# Patient Record
Sex: Female | Born: 1985 | Race: White | Hispanic: No | Marital: Single | Smoking: Current every day smoker
Health system: Southern US, Community
[De-identification: ages and names within clinical notes are randomized; demographics above are authoritative.]

## PROBLEM LIST (undated history)

## (undated) ENCOUNTER — Inpatient Hospital Stay (HOSPITAL_COMMUNITY): Payer: Self-pay

## (undated) DIAGNOSIS — N390 Urinary tract infection, site not specified: Secondary | ICD-10-CM

## (undated) DIAGNOSIS — C539 Malignant neoplasm of cervix uteri, unspecified: Secondary | ICD-10-CM

## (undated) DIAGNOSIS — G8929 Other chronic pain: Secondary | ICD-10-CM

## (undated) DIAGNOSIS — B1920 Unspecified viral hepatitis C without hepatic coma: Secondary | ICD-10-CM

## (undated) DIAGNOSIS — R011 Cardiac murmur, unspecified: Secondary | ICD-10-CM

## (undated) DIAGNOSIS — F4310 Post-traumatic stress disorder, unspecified: Secondary | ICD-10-CM

## (undated) DIAGNOSIS — M545 Low back pain, unspecified: Secondary | ICD-10-CM

## (undated) DIAGNOSIS — K219 Gastro-esophageal reflux disease without esophagitis: Secondary | ICD-10-CM

## (undated) DIAGNOSIS — M009 Pyogenic arthritis, unspecified: Secondary | ICD-10-CM

## (undated) DIAGNOSIS — G43909 Migraine, unspecified, not intractable, without status migrainosus: Secondary | ICD-10-CM

## (undated) DIAGNOSIS — F319 Bipolar disorder, unspecified: Secondary | ICD-10-CM

## (undated) DIAGNOSIS — M069 Rheumatoid arthritis, unspecified: Secondary | ICD-10-CM

## (undated) DIAGNOSIS — I82409 Acute embolism and thrombosis of unspecified deep veins of unspecified lower extremity: Secondary | ICD-10-CM

## (undated) DIAGNOSIS — F32A Depression, unspecified: Secondary | ICD-10-CM

## (undated) DIAGNOSIS — F329 Major depressive disorder, single episode, unspecified: Secondary | ICD-10-CM

## (undated) DIAGNOSIS — M797 Fibromyalgia: Secondary | ICD-10-CM

## (undated) DIAGNOSIS — F419 Anxiety disorder, unspecified: Secondary | ICD-10-CM

## (undated) DIAGNOSIS — C4361 Malignant melanoma of right upper limb, including shoulder: Secondary | ICD-10-CM

## (undated) HISTORY — PX: MELANOMA EXCISION: SHX5266

## (undated) HISTORY — PX: DILATION AND CURETTAGE OF UTERUS: SHX78

## (undated) HISTORY — PX: TONSILLECTOMY: SUR1361

## (undated) HISTORY — PX: WRIST FRACTURE SURGERY: SHX121

## (undated) HISTORY — DX: Pyogenic arthritis, unspecified: M00.9

## (undated) HISTORY — PX: HIP SURGERY: SHX245

## (undated) HISTORY — PX: FRACTURE SURGERY: SHX138

---
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
## 2009-11-11 HISTORY — PX: GYNECOLOGIC CRYOSURGERY: SHX857

## 2017-11-02 ENCOUNTER — Other Ambulatory Visit: Payer: Self-pay

## 2017-11-02 ENCOUNTER — Emergency Department (HOSPITAL_COMMUNITY): Payer: Medicaid Other

## 2017-11-02 ENCOUNTER — Encounter (HOSPITAL_COMMUNITY): Payer: Self-pay | Admitting: *Deleted

## 2017-11-02 ENCOUNTER — Inpatient Hospital Stay (HOSPITAL_COMMUNITY)
Admission: EM | Admit: 2017-11-02 | Discharge: 2017-11-11 | DRG: 418 | Disposition: A | Discharge status: Home / Self Care | Payer: Medicaid Other | Attending: Family Medicine | Admitting: Family Medicine

## 2017-11-02 DIAGNOSIS — B009 Herpesviral infection, unspecified: Secondary | ICD-10-CM | POA: Diagnosis present

## 2017-11-02 DIAGNOSIS — Z8249 Family history of ischemic heart disease and other diseases of the circulatory system: Secondary | ICD-10-CM

## 2017-11-02 DIAGNOSIS — R945 Abnormal results of liver function studies: Secondary | ICD-10-CM

## 2017-11-02 DIAGNOSIS — Z8541 Personal history of malignant neoplasm of cervix uteri: Secondary | ICD-10-CM

## 2017-11-02 DIAGNOSIS — Z590 Homelessness: Secondary | ICD-10-CM

## 2017-11-02 DIAGNOSIS — R112 Nausea with vomiting, unspecified: Secondary | ICD-10-CM | POA: Diagnosis present

## 2017-11-02 DIAGNOSIS — R7989 Other specified abnormal findings of blood chemistry: Secondary | ICD-10-CM | POA: Diagnosis present

## 2017-11-02 DIAGNOSIS — R1011 Right upper quadrant pain: Secondary | ICD-10-CM | POA: Diagnosis not present

## 2017-11-02 DIAGNOSIS — F0630 Mood disorder due to known physiological condition, unspecified: Secondary | ICD-10-CM

## 2017-11-02 DIAGNOSIS — Z86718 Personal history of other venous thrombosis and embolism: Secondary | ICD-10-CM

## 2017-11-02 DIAGNOSIS — G43909 Migraine, unspecified, not intractable, without status migrainosus: Secondary | ICD-10-CM | POA: Diagnosis present

## 2017-11-02 DIAGNOSIS — R0789 Other chest pain: Secondary | ICD-10-CM | POA: Diagnosis present

## 2017-11-02 DIAGNOSIS — K5900 Constipation, unspecified: Secondary | ICD-10-CM

## 2017-11-02 DIAGNOSIS — Z91048 Other nonmedicinal substance allergy status: Secondary | ICD-10-CM

## 2017-11-02 DIAGNOSIS — R748 Abnormal levels of other serum enzymes: Secondary | ICD-10-CM | POA: Diagnosis present

## 2017-11-02 DIAGNOSIS — E038 Other specified hypothyroidism: Secondary | ICD-10-CM | POA: Clinically undetermined

## 2017-11-02 DIAGNOSIS — R740 Nonspecific elevation of levels of transaminase and lactic acid dehydrogenase [LDH]: Secondary | ICD-10-CM

## 2017-11-02 DIAGNOSIS — Z884 Allergy status to anesthetic agent status: Secondary | ICD-10-CM

## 2017-11-02 DIAGNOSIS — R011 Cardiac murmur, unspecified: Secondary | ICD-10-CM | POA: Diagnosis present

## 2017-11-02 DIAGNOSIS — B182 Chronic viral hepatitis C: Secondary | ICD-10-CM | POA: Diagnosis present

## 2017-11-02 DIAGNOSIS — R3915 Urgency of urination: Secondary | ICD-10-CM | POA: Diagnosis not present

## 2017-11-02 DIAGNOSIS — E039 Hypothyroidism, unspecified: Secondary | ICD-10-CM | POA: Diagnosis not present

## 2017-11-02 DIAGNOSIS — F419 Anxiety disorder, unspecified: Secondary | ICD-10-CM | POA: Diagnosis present

## 2017-11-02 DIAGNOSIS — F319 Bipolar disorder, unspecified: Secondary | ICD-10-CM | POA: Diagnosis present

## 2017-11-02 DIAGNOSIS — R109 Unspecified abdominal pain: Secondary | ICD-10-CM

## 2017-11-02 DIAGNOSIS — K219 Gastro-esophageal reflux disease without esophagitis: Secondary | ICD-10-CM | POA: Diagnosis present

## 2017-11-02 DIAGNOSIS — F1910 Other psychoactive substance abuse, uncomplicated: Secondary | ICD-10-CM | POA: Diagnosis not present

## 2017-11-02 DIAGNOSIS — M545 Low back pain: Secondary | ICD-10-CM | POA: Diagnosis present

## 2017-11-02 DIAGNOSIS — Z85820 Personal history of malignant melanoma of skin: Secondary | ICD-10-CM

## 2017-11-02 DIAGNOSIS — M797 Fibromyalgia: Secondary | ICD-10-CM | POA: Diagnosis present

## 2017-11-02 DIAGNOSIS — F4310 Post-traumatic stress disorder, unspecified: Secondary | ICD-10-CM | POA: Diagnosis present

## 2017-11-02 DIAGNOSIS — K811 Chronic cholecystitis: Secondary | ICD-10-CM

## 2017-11-02 DIAGNOSIS — M069 Rheumatoid arthritis, unspecified: Secondary | ICD-10-CM | POA: Diagnosis present

## 2017-11-02 DIAGNOSIS — D649 Anemia, unspecified: Secondary | ICD-10-CM | POA: Diagnosis present

## 2017-11-02 DIAGNOSIS — N119 Chronic tubulo-interstitial nephritis, unspecified: Secondary | ICD-10-CM

## 2017-11-02 DIAGNOSIS — G8929 Other chronic pain: Secondary | ICD-10-CM | POA: Diagnosis present

## 2017-11-02 DIAGNOSIS — F17210 Nicotine dependence, cigarettes, uncomplicated: Secondary | ICD-10-CM | POA: Diagnosis present

## 2017-11-02 DIAGNOSIS — R7401 Elevation of levels of liver transaminase levels: Secondary | ICD-10-CM

## 2017-11-02 DIAGNOSIS — N12 Tubulo-interstitial nephritis, not specified as acute or chronic: Secondary | ICD-10-CM | POA: Diagnosis present

## 2017-11-02 DIAGNOSIS — Z79899 Other long term (current) drug therapy: Secondary | ICD-10-CM

## 2017-11-02 DIAGNOSIS — Z91018 Allergy to other foods: Secondary | ICD-10-CM

## 2017-11-02 HISTORY — DX: Migraine, unspecified, not intractable, without status migrainosus: G43.909

## 2017-11-02 HISTORY — DX: Major depressive disorder, single episode, unspecified: F32.9

## 2017-11-02 HISTORY — DX: Malignant melanoma of right upper limb, including shoulder: C43.61

## 2017-11-02 HISTORY — DX: Low back pain: M54.5

## 2017-11-02 HISTORY — DX: Acute embolism and thrombosis of unspecified deep veins of unspecified lower extremity: I82.409

## 2017-11-02 HISTORY — DX: Depression, unspecified: F32.A

## 2017-11-02 HISTORY — DX: Fibromyalgia: M79.7

## 2017-11-02 HISTORY — DX: Post-traumatic stress disorder, unspecified: F43.10

## 2017-11-02 HISTORY — DX: Low back pain, unspecified: M54.50

## 2017-11-02 HISTORY — DX: Other chronic pain: G89.29

## 2017-11-02 HISTORY — DX: Gastro-esophageal reflux disease without esophagitis: K21.9

## 2017-11-02 HISTORY — DX: Rheumatoid arthritis, unspecified: M06.9

## 2017-11-02 HISTORY — DX: Anxiety disorder, unspecified: F41.9

## 2017-11-02 HISTORY — DX: Malignant neoplasm of cervix uteri, unspecified: C53.9

## 2017-11-02 HISTORY — DX: Cardiac murmur, unspecified: R01.1

## 2017-11-02 HISTORY — DX: Bipolar disorder, unspecified: F31.9

## 2017-11-02 HISTORY — DX: Unspecified viral hepatitis C without hepatic coma: B19.20

## 2017-11-02 HISTORY — DX: Urinary tract infection, site not specified: N39.0

## 2017-11-02 LAB — COMPREHENSIVE METABOLIC PANEL
ALBUMIN: 3.7 g/dL (ref 3.5–5.0)
ALT: 424 U/L — ABNORMAL HIGH (ref 14–54)
ANION GAP: 7 (ref 5–15)
AST: 166 U/L — ABNORMAL HIGH (ref 15–41)
Alkaline Phosphatase: 123 U/L (ref 38–126)
BILIRUBIN TOTAL: 0.7 mg/dL (ref 0.3–1.2)
BUN: 7 mg/dL (ref 6–20)
CO2: 27 mmol/L (ref 22–32)
Calcium: 8.9 mg/dL (ref 8.9–10.3)
Chloride: 106 mmol/L (ref 101–111)
Creatinine, Ser: 0.67 mg/dL (ref 0.44–1.00)
Glucose, Bld: 98 mg/dL (ref 65–99)
POTASSIUM: 3.5 mmol/L (ref 3.5–5.1)
Sodium: 140 mmol/L (ref 135–145)
TOTAL PROTEIN: 7.1 g/dL (ref 6.5–8.1)

## 2017-11-02 LAB — URINALYSIS, ROUTINE W REFLEX MICROSCOPIC
Bacteria, UA: NONE SEEN
Bilirubin Urine: NEGATIVE
GLUCOSE, UA: NEGATIVE mg/dL
Ketones, ur: NEGATIVE mg/dL
Leukocytes, UA: NEGATIVE
Nitrite: NEGATIVE
PH: 6 (ref 5.0–8.0)
Protein, ur: 30 mg/dL — AB
Specific Gravity, Urine: 1.027 (ref 1.005–1.030)

## 2017-11-02 LAB — CBC
HEMATOCRIT: 38 % (ref 36.0–46.0)
Hemoglobin: 12.7 g/dL (ref 12.0–15.0)
MCH: 28.6 pg (ref 26.0–34.0)
MCHC: 33.4 g/dL (ref 30.0–36.0)
MCV: 85.6 fL (ref 78.0–100.0)
Platelets: 218 10*3/uL (ref 150–400)
RBC: 4.44 MIL/uL (ref 3.87–5.11)
RDW: 15.1 % (ref 11.5–15.5)
WBC: 6.4 10*3/uL (ref 4.0–10.5)

## 2017-11-02 LAB — PROTIME-INR
INR: 1.35
Prothrombin Time: 16.6 seconds — ABNORMAL HIGH (ref 11.4–15.2)

## 2017-11-02 LAB — I-STAT BETA HCG BLOOD, ED (MC, WL, AP ONLY)

## 2017-11-02 LAB — ACETAMINOPHEN LEVEL

## 2017-11-02 LAB — ETHANOL

## 2017-11-02 LAB — LIPASE, BLOOD: LIPASE: 88 U/L — AB (ref 11–51)

## 2017-11-02 MED ORDER — ONDANSETRON HCL 4 MG/2ML IJ SOLN
4.0000 mg | Freq: Once | INTRAMUSCULAR | Status: AC
  Administered 2017-11-02: 4 mg via INTRAVENOUS
  Filled 2017-11-02: qty 2

## 2017-11-02 MED ORDER — ONDANSETRON HCL 4 MG PO TABS
4.0000 mg | ORAL_TABLET | Freq: Four times a day (QID) | ORAL | Status: DC | PRN
  Administered 2017-11-09: 4 mg via ORAL
  Filled 2017-11-02 (×2): qty 1

## 2017-11-02 MED ORDER — ONDANSETRON HCL 4 MG/2ML IJ SOLN
4.0000 mg | Freq: Four times a day (QID) | INTRAMUSCULAR | Status: DC | PRN
  Administered 2017-11-03 – 2017-11-08 (×5): 4 mg via INTRAVENOUS
  Filled 2017-11-02 (×8): qty 2

## 2017-11-02 MED ORDER — SODIUM CHLORIDE 0.9 % IV BOLUS (SEPSIS)
1000.0000 mL | Freq: Once | INTRAVENOUS | Status: AC
  Administered 2017-11-02: 1000 mL via INTRAVENOUS

## 2017-11-02 MED ORDER — NICOTINE 21 MG/24HR TD PT24
21.0000 mg | MEDICATED_PATCH | Freq: Every day | TRANSDERMAL | Status: DC
  Administered 2017-11-03 – 2017-11-11 (×10): 21 mg via TRANSDERMAL
  Filled 2017-11-02 (×10): qty 1

## 2017-11-02 MED ORDER — MORPHINE SULFATE (PF) 4 MG/ML IV SOLN
2.0000 mg | INTRAVENOUS | Status: DC | PRN
  Administered 2017-11-02 – 2017-11-03 (×4): 2 mg via INTRAVENOUS
  Filled 2017-11-02 (×4): qty 1

## 2017-11-02 MED ORDER — SODIUM CHLORIDE 0.9 % IV SOLN
INTRAVENOUS | Status: DC
  Administered 2017-11-03 – 2017-11-05 (×5): via INTRAVENOUS

## 2017-11-02 MED ORDER — ENOXAPARIN SODIUM 40 MG/0.4ML ~~LOC~~ SOLN
40.0000 mg | Freq: Every day | SUBCUTANEOUS | Status: DC
  Administered 2017-11-03 – 2017-11-11 (×7): 40 mg via SUBCUTANEOUS
  Filled 2017-11-02 (×9): qty 0.4

## 2017-11-02 NOTE — ED Notes (Signed)
Pt is aware that a urine specimen is needed.

## 2017-11-02 NOTE — ED Notes (Signed)
Patient transported to X-ray 

## 2017-11-02 NOTE — ED Triage Notes (Signed)
Pt reports having gallbladder issues but did not get it addressed due to being at [facility] at time now wants it reevaluated. No acute distress is noted at triage. No n/v/d.

## 2017-11-02 NOTE — ED Notes (Signed)
Admitting provider at bedside.

## 2017-11-02 NOTE — ED Notes (Signed)
ED Provider at bedside. 

## 2017-11-02 NOTE — H&P (Signed)
History and Physical    Yesenia Bennett IHK:[id number] DOB: 19-Jan-1986 DOA: 11/02/2017  Referring MD/NP/PA: Daryll Drown PCP: Patient, No Pcp Per  Patient coming from: Home  Chief Complaint: Right upper quadrant abdominal pain  I have personally briefly reviewed patient's old medical records in [place]  HPI: Yesenia Bennett is a 31 y.o. female with medical history significant of heroin abuse, hepatitis C, anxiety, depression, PTSD, and cervical cancer diagnosed 2010; resents with complaints of right upper quadrant pain for the last 2 weeks.  Describes the pain as a stabbing and cramping pain mostly in the right upper quadrant that radiates to her shoulder.  Eating any food seemed to worsen symptoms.  Associated symptoms include complaints of chills, nausea, nonbloody emesis, poor p.o. intake, heartburn, and dizziness.  Patient was previously seen at [facility] on 12/13 with complaints of nausea and vomiting.  Review of records shows that patient was evaluated and seen to have elevated ALT  267, AST 139, and lipase 41.  It appears she underwent CT scan which showed no acute abnormalities and abdominal ultrasound which showed a small amount of sludge within the gallbladder and mild splenomegaly.  Notes that she has had she just recently completed heroin rehab at [facility], and was the reason why she did not stay at when previously evaluated.  Currently, not on any treatment for her previous history of mood disorders.  ED Course: Upon admission into the emergency room patient was seen to be afebrile with vital signs relatively within normal limits.  Labs revealed AST 166, ALT 424, lipase 88, total bilirubin 0.7, and all other labs relatively within normal limits.  Chest x-ray was unremarkable on right upper quadrant ultrasound showed signs of mild gallbladder wall thickening without signs of gallstones.  She received 2 L of normal saline IV fluids.  General surgery was consulted, but recommended GI  evaluation first as does not appear patient needs a emergent cholecystectomy at this time.  [place] [facility] physician on call was consulted and recommended adding on ethanol level, acetaminophen, hepatitis panel, and INR.  TRH called to admit.  Review of Systems  Constitutional: Positive for chills and malaise/fatigue. Negative for fever.  HENT: Negative for ear discharge and nosebleeds.   Eyes: Negative for pain and discharge.  Respiratory: Positive for cough. Negative for hemoptysis and wheezing.   Cardiovascular: Negative for chest pain and orthopnea.  Gastrointestinal: Positive for abdominal pain, constipation, nausea and vomiting.  Genitourinary: Positive for urgency. Negative for flank pain.  Musculoskeletal: Negative for falls and myalgias.  Skin: Negative for itching and rash.  Neurological: Positive for focal weakness. Negative for sensory change and speech change.  Endo/Heme/Allergies: Negative for polydipsia. Does not bruise/bleed easily.  Psychiatric/Behavioral: Negative for hallucinations and memory loss.    History reviewed. No pertinent past medical history.  History reviewed. No pertinent surgical history.   reports that she has been smoking.  She does not have any smokeless tobacco history on file. She reports that she does not drink alcohol or use drugs.  Allergies  Allergen Reactions  . Citric Acid Anaphylaxis    fruit  . Adhesive [Tape] Other (See Comments)    Blisters   . Erythromycin Swelling and Rash    History reviewed. No pertinent family history.  Prior to Admission medications   Not on File    Physical Exam:  Constitutional: Female who appears to be some generalized discomfort Vitals:   11/02/17 1822  BP: (!) 134/56  Pulse: 72  Resp:  18  Temp: 98 F (36.7 C)  TempSrc: Oral  SpO2: 100%  Weight: 72.6 kg (160 lb)  Height: 5\' 9"  (1.753 m)   Eyes: PERRL, lids and conjunctivae normal ENMT: Mucous membranes are dry. Posterior pharynx clear of any  exudate or lesions. .  Neck: normal, supple, no masses, no thyromegaly Respiratory: Patient intermittently coughing, but sounds clear to auscultation bilaterally, no wheezing, no crackles. Normal respiratory effort. No accessory muscle use.  Cardiovascular: Regular rate and rhythm, no murmurs / rubs / gallops. No extremity edema. 2+ pedal pulses. No carotid bruits.  Abdomen: Tenderness to palpation most notably of the right upper quadrant, but complains of generalized pain when touching in all other quadrants.  Bowel sounds positive. Musculoskeletal: no clubbing / cyanosis. No joint deformity upper and lower extremities. Good ROM, no contractures. Normal muscle tone.  Skin: no rashes, lesions, ulcers. No induration Neurologic: CN 2-12 grossly intact. Sensation intact, DTR normal. Strength 5/5 in all 4.  Psychiatric: Normal judgment and insight. Alert and oriented x 3. Normal mood.     Labs on Admission: I have personally reviewed following labs and imaging studies  CBC: Recent Labs  Lab 11/02/17 2021  WBC 6.4  HGB 12.7  HCT 38.0  MCV 85.6  PLT 374   Basic Metabolic Panel: Recent Labs  Lab 11/02/17 2021  NA 140  K 3.5  CL 106  CO2 27  GLUCOSE 98  BUN 7  CREATININE 0.67  CALCIUM 8.9   GFR: Estimated Creatinine Clearance: 107.5 mL/min (by C-G formula based on SCr of 0.67 mg/dL). Liver Function Tests: Recent Labs  Lab 11/02/17 2021  AST 166*  ALT 424*  ALKPHOS 123  BILITOT 0.7  PROT 7.1  ALBUMIN 3.7   Recent Labs  Lab 11/02/17 2021  LIPASE 88*   No results for input(s): AMMONIA in the last 168 hours. Coagulation Profile: No results for input(s): INR, PROTIME in the last 168 hours. Cardiac Enzymes: No results for input(s): CKTOTAL, CKMB, CKMBINDEX, TROPONINI in the last 168 hours. BNP (last 3 results) No results for input(s): PROBNP in the last 8760 hours. HbA1C: No results for input(s): HGBA1C in the last 72 hours. CBG: No results for input(s): GLUCAP in  the last 168 hours. Lipid Profile: No results for input(s): CHOL, HDL, LDLCALC, TRIG, CHOLHDL, LDLDIRECT in the last 72 hours. Thyroid Function Tests: No results for input(s): TSH, T4TOTAL, FREET4, T3FREE, THYROIDAB in the last 72 hours. Anemia Panel: No results for input(s): VITAMINB12, FOLATE, FERRITIN, TIBC, IRON, RETICCTPCT in the last 72 hours. Urine analysis: No results found for: COLORURINE, APPEARANCEUR, LABSPEC, PHURINE, GLUCOSEU, HGBUR, BILIRUBINUR, KETONESUR, PROTEINUR, UROBILINOGEN, NITRITE, LEUKOCYTESUR Sepsis Labs: No results found for this or any previous visit (from the past 240 hour(s)).   Radiological Exams on Admission: Dg Chest 2 View  Result Date: 11/02/2017 CLINICAL DATA:  31 year old with cough and shortness of breath. EXAM: CHEST  2 VIEW COMPARISON:  None. FINDINGS: The heart size and mediastinal contours are within normal limits. Both lungs are clear. The visualized skeletal structures are unremarkable. IMPRESSION: No active cardiopulmonary disease. Electronically Signed   By: Markus Daft M.D.   On: 11/02/2017 20:43   US Abdomen Limited Ruq  Result Date: 11/02/2017 CLINICAL DATA:  Right upper quadrant pain and vomiting. EXAM: ULTRASOUND ABDOMEN LIMITED RIGHT UPPER QUADRANT COMPARISON:  None. FINDINGS: Gallbladder: Gallbladder is small and contracted. Gallbladder wall is slightly prominent measuring 0.3 cm. Negative for gallstones. Patient does not have a sonographic Murphy sign. Common bile duct:  Diameter: 0.3 cm. Liver: No focal lesion identified. Within normal limits in parenchymal echogenicity. Portal vein is patent on color Doppler imaging with normal direction of blood flow towards the liver. Other:  Large amount of gas in the region of the stomach. IMPRESSION: No acute abnormality. Gallbladder wall is mildly thickened and probably due to the gallbladder contraction. No gallstones. Electronically Signed   By: Markus Daft M.D.   On: 11/02/2017 21:13    U/S  independently reviewed: shows no clear signs of the gallbladder stones   Assessment/Plan Right upper quadrant abdominal pain, elevated lipase, elevated LFTs: Acute.  Patient presents with complaints of worsening right upper quadrant abdominal pain with radiation to her shoulder.  Ultrasound showed mild signs of gallbladder wall thickening suspected secondary to contraction and no gallstones.  Due to the elevation in liver enzymes and lipase question the possibility of a large gallstone. Gastroenterology consulted and will see the patient in a.m.  Will likely need MRCP in am.  - Admit to a MedSurg bed - Monitor intake and output - Normal Saline at 125 mL/h - Morphine IV prn pain - Appreciate GI consultative service, will follow-up for further recommendations - Will Follow-up hepatitis panel, ethanol level, acetaminophen level, and INR  Urinary urgency: Acute.  Patient reports having urinary urgency and pressure symptoms. - Check urinalysis   Hepatitis C: Records show diagnosed with hepatitis C Genotype  1a  back in 2015-2016, and has not had treatment for this at this time.  - Follow-up repeat hepatitis panel   History of subclinical hypothyroidism: Previously documented back in 2016. - Check TSH    History of polysubstance abuse: Previous history of heroin.  Still current uses  tobacco, and unclear about other drugs. - Nicotine patch  Mood disorder history: Patient with previous history of PTSD, anxiety, depression, other diagnoses currently not on any treatment for these at this time.  DVT prophylaxis: Lovenox   Code Status: Full  Family Communication:Plan of care discussed with patient and family present at bedside Disposition Plan:  TBD Consults called:  gastroenterology Admission status: observation  Norval Morton MD [facility] Pager [phone]   If 7PM-7AM, please contact night-coverage [email or web address] Password [facility]  11/02/2017, 10:09 PM

## 2017-11-02 NOTE — ED Provider Notes (Signed)
[place] EMERGENCY DEPARTMENT Provider Note   CSN: [id number] Arrival date & time: 11/02/17  1818     History   Chief Complaint Chief Complaint  Patient presents with  . Abdominal Pain  . Follow-up    Yesenia Bennett is a 31 y.o. female.  HPI  Pleasant 31 year old female here with right upper quadrant pain.  The patient states that she recently completed rehab for heroin at [facility].  She was diagnosed with gallbladder sludge at that time.  She refused admission at for site, however, because she did not lose her bed [facility].  She was then discharged 2 days later due to worsening epigastric and right upper quadrant abdominal pain.  She states that she has aching, throbbing, cramp like right upper quadrant pain that is worse with eating.  It then generally resolves but is becoming more frequent as well as long in duration.  She denies any fevers.  She has had light colored stools.  Denies any changes in her urine.  No vaginal bleeding or discharge.  History reviewed. No pertinent past medical history.  There are no active problems to display for this patient.   History reviewed. No pertinent surgical history.  OB History    No data available       Home Medications    Prior to Admission medications   Not on File    Family History History reviewed. No pertinent family history.  Social History Social History   Tobacco Use  . Smoking status: Current Some Day Smoker  Substance Use Topics  . Alcohol use: No    Frequency: Never  . Drug use: No     Allergies   Adhesive [tape] and Erythromycin   Review of Systems Review of Systems  Constitutional: Negative for chills and fever.  HENT: Negative for congestion, rhinorrhea and sore throat.   Eyes: Negative for visual disturbance.  Respiratory: Negative for cough, shortness of breath and wheezing.   Cardiovascular: Negative for chest pain and leg swelling.  Gastrointestinal: Positive for abdominal  pain, nausea and vomiting. Negative for diarrhea.  Genitourinary: Negative for dysuria, flank pain, vaginal bleeding and vaginal discharge.  Musculoskeletal: Negative for neck pain.  Skin: Negative for rash.  Allergic/Immunologic: Negative for immunocompromised state.  Neurological: Negative for syncope and headaches.  Hematological: Does not bruise/bleed easily.  All other systems reviewed and are negative.    Physical Exam Updated Vital Signs BP (!) 134/56 (BP Location: Right Arm)   Pulse 72   Temp 98 F (36.7 C) (Oral)   Resp 18   Ht 5\' 9"  (1.753 m)   Wt 72.6 kg (160 lb)   LMP 11/01/2017   SpO2 100%   BMI 23.63 kg/m   Physical Exam  Constitutional: She is oriented to person, place, and time. She appears well-developed and well-nourished. No distress.  HENT:  Head: Normocephalic and atraumatic.  Eyes: Conjunctivae are normal.  Neck: Neck supple.  Cardiovascular: Regular rhythm and normal heart sounds. Exam reveals no friction rub.  No murmur heard. Pulmonary/Chest: Effort normal and breath sounds normal. No respiratory distress. She has no wheezes. She has no rales.  Abdominal: Soft. Normal appearance. She exhibits no distension. There is generalized tenderness and tenderness in the right upper quadrant. There is positive Murphy's sign. There is no rigidity, no rebound and no guarding.  Musculoskeletal: She exhibits no edema.  Neurological: She is alert and oriented to person, place, and time. She exhibits normal muscle tone.  Skin: Skin is warm.  Capillary refill takes less than 2 seconds.  Psychiatric: She has a normal mood and affect.  Nursing note and vitals reviewed.    ED Treatments / Results  Labs (all labs ordered are listed, but only abnormal results are displayed) Labs Reviewed  COMPREHENSIVE METABOLIC PANEL - Abnormal; Notable for the following components:      Result Value   AST 166 (*)    ALT 424 (*)    All other components within normal limits    LIPASE, BLOOD - Abnormal; Notable for the following components:   Lipase 88 (*)    All other components within normal limits  CBC  URINALYSIS, ROUTINE W REFLEX MICROSCOPIC  PROTIME-INR  HEPATITIS PANEL, ACUTE  ETHANOL  ACETAMINOPHEN LEVEL  I-STAT BETA HCG BLOOD, ED ([facility], [facility], [facility] ONLY)    EKG  EKG Interpretation None       Radiology Dg Chest 2 View  Result Date: 11/02/2017 CLINICAL DATA:  31 year old with cough and shortness of breath. EXAM: CHEST  2 VIEW COMPARISON:  None. FINDINGS: The heart size and mediastinal contours are within normal limits. Both lungs are clear. The visualized skeletal structures are unremarkable. IMPRESSION: No active cardiopulmonary disease. Electronically Signed   By: Markus Daft M.D.   On: 11/02/2017 20:43   US Abdomen Limited Ruq  Result Date: 11/02/2017 CLINICAL DATA:  Right upper quadrant pain and vomiting. EXAM: ULTRASOUND ABDOMEN LIMITED RIGHT UPPER QUADRANT COMPARISON:  None. FINDINGS: Gallbladder: Gallbladder is small and contracted. Gallbladder wall is slightly prominent measuring 0.3 cm. Negative for gallstones. Patient does not have a sonographic Murphy sign. Common bile duct: Diameter: 0.3 cm. Liver: No focal lesion identified. Within normal limits in parenchymal echogenicity. Portal vein is patent on color Doppler imaging with normal direction of blood flow towards the liver. Other:  Large amount of gas in the region of the stomach. IMPRESSION: No acute abnormality. Gallbladder wall is mildly thickened and probably due to the gallbladder contraction. No gallstones. Electronically Signed   By: Markus Daft M.D.   On: 11/02/2017 21:13    Procedures Procedures (including critical care time)  Emergency Ultrasound Study:   Angiocath insertion Performed by: Evonnie Pat Consent: Verbal consent/emergent consent obtained. Risks and benefits: risks, benefits and alternatives were discussed Immediately prior to procedure the correct patient,  procedure, equipment, support staff and site/side marked as needed.  Indication: difficult IV access Preparation: Patient was prepped and draped in the usual sterile fashion. Sterile gel was used for this procedure and the ultrasound probe was sterilized prior to use. Vein Location: Right forearm vein was visualized during assessment for potential access sites and was found to be patent/ easily compressed with linear ultrasound.  The needle was visualized with real-time ultrasound and guided into the vein. Gauge: 20  Image saved and stored.  Normal blood return.   Patient tolerance: Patient tolerated the procedure well with no immediate complications.       Medications Ordered in ED Medications  sodium chloride 0.9 % bolus 1,000 mL (not administered)  sodium chloride 0.9 % bolus 1,000 mL (0 mLs Intravenous Stopped 11/02/17 2143)  ondansetron (ZOFRAN) injection 4 mg (4 mg Intravenous Given 11/02/17 2025)     Initial Impression / Assessment and Plan / ED Course  I have reviewed the triage vital signs and the nursing notes.  Pertinent labs & imaging results that were available during my care of the patient were reviewed by me and considered in my medical decision making (see chart for details).  31 yo F with PMHx as above including Hep C, IVDU here with epigastric/RUQ abd pain with n/v. She has associated light-colored stools. Vomiting in ED. Lab work shows progressively worsening transaminitis and now lipase elevation. Concern for sludge causing CBD obstruction, versus acute hepatitis though this wouldn't necessarily explain her lipase. No structural abnormalities noted on CT 12/13. Will give fluids. She's actively vomiting here in ED. Suspect that with her ongoing n/v and elevated LFTs/lipase, will need to be admitted.  D/w Dr. Grandville Silos - recommends GI evaluation, may need eventual chole but no signs of it needed at this time. Awaiting page from GI.  D/w [facility] physician on  call - recommends MRCP, admission for fluids/monitoring. GI will see pt. Continue supportive care. I've added on INR, APAP, ethanol. Admit to medicine.   Final Clinical Impressions(s) / ED Diagnoses   Final diagnoses:  RUQ pain  Transaminitis  Elevated lipase    ED Discharge Orders    None       Duffy Bruce, MD 11/02/17 2147

## 2017-11-03 ENCOUNTER — Observation Stay (HOSPITAL_COMMUNITY): Payer: Medicaid Other

## 2017-11-03 ENCOUNTER — Encounter (HOSPITAL_COMMUNITY): Payer: Self-pay

## 2017-11-03 ENCOUNTER — Other Ambulatory Visit: Payer: Self-pay

## 2017-11-03 DIAGNOSIS — F0630 Mood disorder due to known physiological condition, unspecified: Secondary | ICD-10-CM

## 2017-11-03 DIAGNOSIS — R740 Nonspecific elevation of levels of transaminase and lactic acid dehydrogenase [LDH]: Secondary | ICD-10-CM

## 2017-11-03 DIAGNOSIS — R7989 Other specified abnormal findings of blood chemistry: Secondary | ICD-10-CM | POA: Diagnosis present

## 2017-11-03 DIAGNOSIS — E039 Hypothyroidism, unspecified: Secondary | ICD-10-CM | POA: Clinically undetermined

## 2017-11-03 DIAGNOSIS — R109 Unspecified abdominal pain: Secondary | ICD-10-CM | POA: Diagnosis not present

## 2017-11-03 DIAGNOSIS — R748 Abnormal levels of other serum enzymes: Secondary | ICD-10-CM | POA: Diagnosis not present

## 2017-11-03 DIAGNOSIS — R3915 Urgency of urination: Secondary | ICD-10-CM | POA: Diagnosis not present

## 2017-11-03 DIAGNOSIS — R1011 Right upper quadrant pain: Secondary | ICD-10-CM | POA: Diagnosis not present

## 2017-11-03 DIAGNOSIS — B182 Chronic viral hepatitis C: Secondary | ICD-10-CM

## 2017-11-03 DIAGNOSIS — F1910 Other psychoactive substance abuse, uncomplicated: Secondary | ICD-10-CM | POA: Diagnosis present

## 2017-11-03 DIAGNOSIS — K811 Chronic cholecystitis: Secondary | ICD-10-CM | POA: Diagnosis not present

## 2017-11-03 DIAGNOSIS — R945 Abnormal results of liver function studies: Secondary | ICD-10-CM | POA: Diagnosis not present

## 2017-11-03 DIAGNOSIS — E038 Other specified hypothyroidism: Secondary | ICD-10-CM | POA: Clinically undetermined

## 2017-11-03 LAB — COMPREHENSIVE METABOLIC PANEL
ALK PHOS: 91 U/L (ref 38–126)
ALT: 337 U/L — ABNORMAL HIGH (ref 14–54)
ANION GAP: 5 (ref 5–15)
AST: 129 U/L — ABNORMAL HIGH (ref 15–41)
Albumin: 3.3 g/dL — ABNORMAL LOW (ref 3.5–5.0)
BILIRUBIN TOTAL: 0.8 mg/dL (ref 0.3–1.2)
BUN: 6 mg/dL (ref 6–20)
CALCIUM: 8.1 mg/dL — AB (ref 8.9–10.3)
CO2: 23 mmol/L (ref 22–32)
Chloride: 112 mmol/L — ABNORMAL HIGH (ref 101–111)
Creatinine, Ser: 0.7 mg/dL (ref 0.44–1.00)
GFR calc non Af Amer: >60 mL/min (ref >60)
Glucose, Bld: 102 mg/dL — ABNORMAL HIGH (ref 65–99)
Potassium: 3.8 mmol/L (ref 3.5–5.1)
SODIUM: 140 mmol/L (ref 135–145)
TOTAL PROTEIN: 6 g/dL — AB (ref 6.5–8.1)

## 2017-11-03 LAB — CBC
HEMATOCRIT: 35.3 % — AB (ref 36.0–46.0)
HEMOGLOBIN: 11.3 g/dL — AB (ref 12.0–15.0)
MCH: 27.8 pg (ref 26.0–34.0)
MCHC: 32 g/dL (ref 30.0–36.0)
MCV: 86.7 fL (ref 78.0–100.0)
Platelets: 167 10*3/uL (ref 150–400)
RBC: 4.07 MIL/uL (ref 3.87–5.11)
RDW: 15.5 % (ref 11.5–15.5)
WBC: 4.6 10*3/uL (ref 4.0–10.5)

## 2017-11-03 LAB — HIV ANTIBODY (ROUTINE TESTING W REFLEX): HIV Screen 4th Generation wRfx: NONREACTIVE

## 2017-11-03 LAB — TSH: TSH: 0.404 u[IU]/mL (ref 0.350–4.500)

## 2017-11-03 MED ORDER — MORPHINE SULFATE (PF) 4 MG/ML IV SOLN
INTRAVENOUS | Status: AC
  Filled 2017-11-03: qty 1

## 2017-11-03 MED ORDER — MORPHINE SULFATE (PF) 4 MG/ML IV SOLN
3.0000 mg | Freq: Once | INTRAVENOUS | Status: AC
  Administered 2017-11-03: 3 mg via INTRAVENOUS

## 2017-11-03 MED ORDER — MORPHINE SULFATE (PF) 4 MG/ML IV SOLN: 2.0000 mg | INTRAVENOUS | Status: DC | PRN

## 2017-11-03 MED ORDER — KETOROLAC TROMETHAMINE 30 MG/ML IJ SOLN
30.0000 mg | Freq: Four times a day (QID) | INTRAMUSCULAR | Status: AC | PRN
  Administered 2017-11-03 – 2017-11-04 (×5): 30 mg via INTRAVENOUS
  Filled 2017-11-03 (×5): qty 1

## 2017-11-03 MED ORDER — TECHNETIUM TC 99M MEBROFENIN IV KIT
4.6100 | PACK | Freq: Once | INTRAVENOUS | Status: AC | PRN
  Administered 2017-11-03: 4.61 via INTRAVENOUS

## 2017-11-03 NOTE — Consult Note (Addendum)
[facility] Consult Note   History Yee Gangi MRN # [id number]  Date of Admission: 11/02/2017 Date of Consultation: 11/03/2017 Referring physician: Dr. Alfredia Ferguson, Bertram Savin, DO Primary Care Provider: Patient, No Pcp Per Primary Gastroenterologist: None   Reason for Consultation/Chief Complaint: Right upper quadrant abdominal pain  Subjective  HPI:  This is a 31 year old woman admitted last evening through the ED for right upper quadrant abdominal pain that is been going on for over 10 days.  She does not recall having had any previous abdominal pain.  She was admitted to a local opioid rehab clinic, and had been there about 3 weeks when she developed right upper quadrant pain that was fairly constant with sometimes acute episodes of a shooting pain into the shoulder or the mid back.  This brought her to the [facility] ED on 10/23/2017, that ED note is available in care everywhere and was reviewed.  An ultrasound showed perhaps some gallbladder sludge but no gallbladder wall thickening or pericholecystic inflammation.  LFTs were elevated, and it was noted to the patient is been diagnosed with chronic hepatitis C several years ago.  They were planning to admit the patient, but she left AGAINST MEDICAL ADVICE that she would not lose her spot at rehab.  She says she returned to their and 2 days later was medically discharged from that rehab because she continued to have this abdominal pain with intermittent nausea and vomiting.  She says she was discharged to a shelter in [place].  She continued to have the symptoms until about 2 days ago when her boyfriend Elta Guadeloupe (who is present in the room for the entire encounter) was also discharged from rehab, and he urged her to come to the ED yesterday. Her symptoms have been unrelenting since then with fairly constant pain that has episodic worsening she describes as "sharp and cramping and sometimes radiating into the back.  She says it was  made worse by the fatty food they were fed at the rehab facility. Her bowel habits have been irregular, previously constipated while on opiates and then diarrhea while at rehab, and now they are of variable form, but there is no rectal bleeding.  She describes some frequent heartburn which she attributes to the intermittent vomiting.  She denies dysphagia or odynophagia.  ROS:  Constitutional: She is uncertain if there is been weight loss  Psychiatric:   She admits to chronic anxiety and depression  All other systems are negative except as noted above in the HPI  Past Medical History Past Medical History:  Diagnosis Date  . Anxiety and depression   . Cervical cancer ([place])    Unclear if patient had cervical cancer/ dysplasia, but underwent cryotherapy  . Hepatitis C   . PTSD (post-traumatic stress disorder)     Past Surgical History Past Surgical History:  Procedure Laterality Date  . CESAREAN SECTION     x2  . OTHER SURGICAL HISTORY     Cryosurgery    Family History Family History  Problem Relation Age of Onset  . Heart disease Father     Social History Social History   Socioeconomic History  . Marital status: Single    Spouse name: None  . Number of children: None  . Years of education: None  . Highest education level: None  Social Needs  . Financial resource strain: None  . Food insecurity - worry: None  . Food insecurity - inability: None  . Transportation needs - medical: None  . Transportation needs -  non-medical: None  Occupational History  . None  Tobacco Use  . Smoking status: Current Some Day Smoker  . Smokeless tobacco: Never Used  Substance and Sexual Activity  . Alcohol use: No    Frequency: Never  . Drug use: No  . Sexual activity: None  Other Topics Concern  . None  Social History Narrative  . None    Allergies Allergies  Allergen Reactions  . Citric Acid Anaphylaxis    fruit  . Adhesive [Tape] Other (See Comments)    Blisters    . Erythromycin Swelling and Rash    Outpatient Meds Home medications from the H+P and/or nursing med reconciliation reviewed.  Inpatient med list reviewed  _____________________________________________________________________ Objective   Exam:  Current vital signs  Patient Vitals for the past 8 hrs:  BP Pulse Resp SpO2  11/03/17 0349 116/87 (!) 45 18 100 %    Intake/Output Summary (Last 24 hours) at 11/03/2017 3532 Last data filed at 11/02/2017 2325 Gross per 24 hour  Intake 2000 ml  Output -  Net 2000 ml    Physical Exam:    General: this is a visibly uncomfortable and anxious appearing young woman with her hand in the right upper quadrant.  Eyes: sclera anicteric, no redness  ENT: oral mucosa moist without lesions, no cervical or supraclavicular lymphadenopathy, good dentition  CV: RRR without murmur, S1/S2, no JVD,, no peripheral edema  Resp: clear to auscultation bilaterally, normal RR and effort noted  GI: soft, right upper quadrant tenderness with an exaggerated response to light palpation of the abdominal wall, with active bowel sounds. No guarding or palpable organomegaly noted  Skin; warm and dry, no rash or jaundice noted.  Tattoos  Neuro: awake, alert and oriented x 3. Normal gross motor function and fluent speech.  Labs:  Recent Labs  Lab 11/02/17 2021 11/03/17 0709  WBC 6.4 4.6  HGB 12.7 11.3*  HCT 38.0 35.3*  PLT 218 167   Recent Labs  Lab 11/03/17 0709  NA 140  K 3.8  CL 112*  CO2 23  BUN 6  ALBUMIN 3.3*  ALKPHOS 91  ALT 337*  AST 129*  GLUCOSE 102*  Total bilirubin 0.8  Recent Labs  Lab 11/02/17 2155  INR 1.35   Tylenol level negative yesterday  INR 1.3  TSH normal at 0.4  Radiologic studies:  Ultrasound in the ED yesterday shows a contracted gallbladder.  CBD 3 mm  Ultrasound report from the ED at [facility] on 10/23/2017 as described above CT abdomen and pelvis from 10/23/2017 was a normal  study  @ASSESSMENTPLANBEGIN @ Impression:  Right upper quadrant pain Elevated LFTs History of opioid abuse and recent participation in rehab Chronic hepatitis C, no previous treatment, not yet a candidate for treatment. History of psychiatric disorder including PTSD, depression and anxiety on no current therapy  The cause of this pain is very difficult to determine with certainty.  There is no convincing evidence of acute cholecystitis on ultrasound.  I suspect the mildly thickened appearance of the gallbladder wall is likely from contraction.  There is only recently some sludge but no stones on ultrasound less than 2 weeks ago. I think her elevated LFTs are from chronic hepatitis C and not indicative of a common bile duct stone. The symptoms are not typical for peptic ulcer and I have no current plans for endoscopic procedures.  There was no evidence obstruction, neoplasia or areas of inflammation on recent abdominal CT scan.  It is possible this may  be related to recent cessation of opioids.  Recommendations:  I would not obtain an MRCP on this patient, as the pretest suspicion for a CBD stone is very low, and the sensitivity and specificity is also low with a 3 mm CBD.  Given her acute pain, I think it would be subject to motion degradation and then questionable findings. I agree with surgical consultation, although I suspect will also be difficult for them to determine with certainty if the gallbladder is the source of the symptoms.  They may wish to order a HIDA scan to rule out acute cholecystitis with certainty.  Thank you for the courtesy of this consult.  Please contact me with any questions or concerns.  Nelida Meuse III Pager: [phone] Mon-Fri 8a-5p [phone] after 5p, weekends, holidays

## 2017-11-03 NOTE — Progress Notes (Signed)
Pt c/o chest tightness at this time, after having PRN morphine that was ineffective for her abdominal pain. VSS, see flowsheet. MD paged, stated no new orders at this time.

## 2017-11-03 NOTE — ED Notes (Signed)
Nehemiah Settle RN

## 2017-11-03 NOTE — ED Notes (Signed)
Attempted report x 1 to 6N

## 2017-11-03 NOTE — ED Notes (Signed)
Attempted report x2 to 6N 

## 2017-11-03 NOTE — Progress Notes (Signed)
Waste entry in pyxis for Morphine is 3 mg but actually wasted 1 mg as witnessed by Derald Macleod, RN. Patient given 3 mg of Morphine IV for nuclear medicine study.

## 2017-11-03 NOTE — Progress Notes (Signed)
PROGRESS NOTE    Yesenia Bennett  ZOX:[id number] DOB: 01/15/1986 DOA: 11/02/2017 PCP: System, Pcp Not In   Brief Narrative:  HPI Per Dr. Elesa Hacker is a 31 y.o. female with medical history significant of Heroin Abuse, hepatitis C, anxiety, depression, PTSD, and cervical cancer diagnosed 2010; resents with complaints of right upper quadrant pain for the last 2 weeks. Describes the pain as a stabbing and cramping pain mostly in the right upper quadrant that radiates to her shoulder. Eating any food seemed to worsen symptoms.  Associated symptoms include complaints of chills, nausea, nonbloody emesis, poor p.o. intake, heartburn, and dizziness.  Patient was previously seen at [facility] on 12/13 with complaints of nausea and vomiting.  Review of records shows that patient was evaluated and seen to have elevated ALT  267, AST 139, and lipase 41.  It appeared she underwent CT scan which showed no acute abnormalities and abdominal ultrasound which showed a small amount of sludge within the gallbladder and mild splenomegaly.  Notes that she has had she just recently completed Heroin rehab at [facility], and was the reason why she did not stay at when previously evaluated.  Currently, not on any treatment for her previous history of mood disorders.  Upon admission into the emergency room patient was seen to be afebrile with vital signs relatively within normal limits.  Labs revealed AST 166, ALT 424, lipase 88, total bilirubin 0.7, and all other labs relatively within normal limits.  Chest x-ray was unremarkable on right upper quadrant ultrasound showed signs of mild gallbladder wall thickening without signs of gallstones.  She received 2 L of normal saline IV fluids.  General surgery was consulted, but recommended GI evaluation first as does not appear patient needs a emergent cholecystectomy at this time.  [facility] physician on call was consulted and recommended adding on ethanol level, acetaminophen,  hepatitis panel, and INR.  [facility] called to admit.  Patient currently still having Pain. Gastroenterology evaluated and recommend against MRCP and recommending HIDA scan and Surgical Consultation.   Assessment & Plan:   Principal Problem:   RUQ pain Active Problems:   Elevated LFTs   Elevated lipase   Urinary urgency   Chronic hepatitis C without hepatic coma (HCC)   Subclinical hypothyroidism   Mood disorder in conditions classified elsewhere   Polysubstance abuse (HCC)  Right Upper Quadrant Abdominal Pain associated with evated Lipase, elevated LFTs - Acute. Patient presents with complaints of worsening right upper quadrant abdominal pain with radiation to her shoulder.   -Lipase was 88 on Admission, AST was 166, ALT was 424 -Ultrasound showed mild signs of gallbladder wall thickening suspected secondary to contraction and no gallstones. Due to the elevation in liver enzymes and lipase question the possibility of a large gallstone.  -Gastroenterology consulted and evaluated and feel like cause of pain is difficult to determine and feel there is no convincing evidence of Acute Cholecystitis and do not feel these are typical for Peptic Ulcer so are not pursuing further endoscopic procedures.  -Admitted to a MedSurg bed -Continue to Monitor intake and output -C/wormal Saline at 125 mL/h -Morphine IV prn pain D/C'd and started IV Ketorolac  -Acetaminophen Level was <10, PT-INR was 16.6-1.35, and EtOH level was <10  -Gastroenterology recommending against MRCP and recommend obtaining a HIDA Scan -Discussed with Surgery PA and if HIDA is abnormal will need to formally consult General Surgery -HIDA currently being done -Hx of being on  Suboxone 8-2 mg Film SL BID -  Repeat CMP in AM   Urinary Urgency -Acute.  Patient reports having urinary urgency and pressure symptoms. -Checked Urinalysis and showed Hazy Appearance, Amber Color, Large Hb, 6-30 RBC, 0-5 WBC and Negative Leukocytes and  Nitrites -Continue to Monitor   Hepatitis C -Records show diagnosed with hepatitis C Genotype  1a  back in 2015-2016, and has not had treatment for this at this time.  -AST went from 166 -> 129 -ALT went from 424 -> 337 -Follow-up Repeat Hepatitis Panel  -Per Gastroenterology not a Candidate for Treatment   History of Subclinical Hypothyroidism -Previously documented back in 2016. -Checked TSH and was 0.404  History of Polysubstance Abuse including Opioid Abuse  -Previous history of Heroin Abuse -Avoid Narcotics and D/C'd IV Morphine and started IV Ketorolac for Patient; Will need to obtain records from [facility] if patient still sees them -Still current uses  tobacco, and unclear about other drugs. -C/w Nicotine Patch 21 mg TD q24h  Mood Disorder History -Patient with previous history of PTSD, Anxiety, Depression, other diagnoses -Currently not on any treatment for these at this time but review shows she has been on Citalopram 40 mg po Daily and Oxcarbazepine 300 mg po BID  Normocytic Anemia -Likely dilutional Drop though Urinalysis did show some blood -Hb/Hct went from 12.7/38.0 -> 11.3/35.3 -Continue to Monitor for S/Sx of Bleeding as patient has Hx of Cervical Cancer and Hematuria with Aspirin, and Ibuprofen  -Repeat CBC in AM   Hx of Cervical Cancer -Follow up with Oncology as an outpatient  Hx of Herpes Simplex -Review of Records from [facility] show patient has been on Valacyclovir in the past  DVT prophylaxis: Enoxaparin 40 mg sq q24h Code Status: FULL CODE Family Communication: Discussed with boyfriend at bedside Disposition Plan: Anticipate D/C in next 24-48 hours if medically stable   Consultants:   Gastroenterology Dr. Wilfrid Lund   Procedures:  HIDA Scan (Patient currently undergoing)   Antimicrobials:  Anti-infectives (From admission, onward)   None     Subjective: Seen and examined at bedside and was still complaining of Abdominal Pain. No  CP or SOB. No nausea or vomiting today.   Objective: Vitals:   11/03/17 0035 11/03/17 0349 11/03/17 1407 11/03/17 1458  BP: 117/60 116/87 (!) 144/94   Pulse: (!) 52 (!) 45 (!) 54   Resp: 19 18 18    Temp: 98.3 F (36.8 C)  98.8 F (37.1 C) 99.2 F (37.3 C)  TempSrc: Oral  Oral Oral  SpO2: 100% 100% 100%   Weight: 74.6 kg (164 lb 7.4 oz)     Height: 5\' 9"  (1.753 m)       Intake/Output Summary (Last 24 hours) at 11/03/2017 1844 Last data filed at 11/03/2017 1145 Gross per 24 hour  Intake 2000 ml  Output 600 ml  Net 1400 ml   Filed Weights   11/02/17 1822 11/03/17 0035  Weight: 72.6 kg (160 lb) 74.6 kg (164 lb 7.4 oz)   Examination: Physical Exam:  Constitutional: WN/WD Caucasian female in NAD and appears calm and comfortable Eyes: Lids and conjunctivae normal, sclerae anicteric  ENMT: External Ears, Nose appear normal. Grossly normal hearing. Mucous membranes are moist. Posterior pharynx clear of any exudate or lesions. Poor Dentition  Neck: Appears normal, supple, no cervical masses, normal ROM, no appreciable thyromegaly; no JVD Respiratory: Diminished to auscultation bilaterally, no wheezing, rales, rhonchi or crackles. Normal respiratory effort and patient is not tachypenic. No accessory muscle use.  Cardiovascular: RRR, no murmurs / rubs /  gallops. S1 and S2 auscultated. No extremity edema. Abdomen: Soft, Tender to palpate on Right side, non-distended. No masses palpated. No appreciable hepatosplenomegaly. Bowel sounds positive x4.  GU: Deferred. Musculoskeletal: No clubbing / cyanosis of digits/nails. No joint deformity upper and lower extremities.   Skin: No rashes, lesions, ulcers on a limited skin eval. No induration; Warm and dry.  Neurologic: CN 2-12 grossly intact with no focal deficits. Romberg sign and cerebellar reflexes not assessed.  Psychiatric: Normal judgment and insight. Alert and oriented x 3. Depressed appearing mood and appropriate affect.   Data  Reviewed: I have personally reviewed following labs and imaging studies  CBC: Recent Labs  Lab 11/02/17 2021 11/03/17 0709  WBC 6.4 4.6  HGB 12.7 11.3*  HCT 38.0 35.3*  MCV 85.6 86.7  PLT 218 789   Basic Metabolic Panel: Recent Labs  Lab 11/02/17 2021 11/03/17 0709  NA 140 140  K 3.5 3.8  CL 106 112*  CO2 27 23  GLUCOSE 98 102*  BUN 7 6  CREATININE 0.67 0.70  CALCIUM 8.9 8.1*   GFR: Estimated Creatinine Clearance: 107.5 mL/min (by C-G formula based on SCr of 0.7 mg/dL). Liver Function Tests: Recent Labs  Lab 11/02/17 2021 11/03/17 0709  AST 166* 129*  ALT 424* 337*  ALKPHOS 123 91  BILITOT 0.7 0.8  PROT 7.1 6.0*  ALBUMIN 3.7 3.3*   Recent Labs  Lab 11/02/17 2021  LIPASE 88*   No results for input(s): AMMONIA in the last 168 hours. Coagulation Profile: Recent Labs  Lab 11/02/17 2155  INR 1.35   Cardiac Enzymes: No results for input(s): CKTOTAL, CKMB, CKMBINDEX, TROPONINI in the last 168 hours. BNP (last 3 results) No results for input(s): PROBNP in the last 8760 hours. HbA1C: No results for input(s): HGBA1C in the last 72 hours. CBG: No results for input(s): GLUCAP in the last 168 hours. Lipid Profile: No results for input(s): CHOL, HDL, LDLCALC, TRIG, CHOLHDL, LDLDIRECT in the last 72 hours. Thyroid Function Tests: Recent Labs    11/03/17 0709  TSH 0.404   Anemia Panel: No results for input(s): VITAMINB12, FOLATE, FERRITIN, TIBC, IRON, RETICCTPCT in the last 72 hours. Sepsis Labs: No results for input(s): PROCALCITON, LATICACIDVEN in the last 168 hours.  No results found for this or any previous visit (from the past 240 hour(s)).   Radiology Studies: Dg Chest 2 View  Result Date: 11/02/2017 CLINICAL DATA:  31 year old with cough and shortness of breath. EXAM: CHEST  2 VIEW COMPARISON:  None. FINDINGS: The heart size and mediastinal contours are within normal limits. Both lungs are clear. The visualized skeletal structures are  unremarkable. IMPRESSION: No active cardiopulmonary disease. Electronically Signed   By: Markus Daft M.D.   On: 11/02/2017 20:43   US Abdomen Limited Ruq  Result Date: 11/02/2017 CLINICAL DATA:  Right upper quadrant pain and vomiting. EXAM: ULTRASOUND ABDOMEN LIMITED RIGHT UPPER QUADRANT COMPARISON:  None. FINDINGS: Gallbladder: Gallbladder is small and contracted. Gallbladder wall is slightly prominent measuring 0.3 cm. Negative for gallstones. Patient does not have a sonographic Murphy sign. Common bile duct: Diameter: 0.3 cm. Liver: No focal lesion identified. Within normal limits in parenchymal echogenicity. Portal vein is patent on color Doppler imaging with normal direction of blood flow towards the liver. Other:  Large amount of gas in the region of the stomach. IMPRESSION: No acute abnormality. Gallbladder wall is mildly thickened and probably due to the gallbladder contraction. No gallstones. Electronically Signed   By: Scherrie Gerlach.D.  On: 11/02/2017 21:13   Scheduled Meds: . morphine      . enoxaparin (LOVENOX) injection  40 mg Subcutaneous Daily  . nicotine  21 mg Transdermal Daily   Continuous Infusions: . sodium chloride 125 mL/hr at 11/03/17 0803    LOS: 0 days   Kerney Elbe, DO [facility] Pager [phone]  If 7PM-7AM, please contact night-coverage [email or web address] Password [facility] 11/03/2017, 6:44 PM

## 2017-11-04 DIAGNOSIS — Z86718 Personal history of other venous thrombosis and embolism: Secondary | ICD-10-CM | POA: Diagnosis not present

## 2017-11-04 DIAGNOSIS — Z8541 Personal history of malignant neoplasm of cervix uteri: Secondary | ICD-10-CM | POA: Diagnosis not present

## 2017-11-04 DIAGNOSIS — R3915 Urgency of urination: Secondary | ICD-10-CM | POA: Diagnosis not present

## 2017-11-04 DIAGNOSIS — Z91048 Other nonmedicinal substance allergy status: Secondary | ICD-10-CM | POA: Diagnosis not present

## 2017-11-04 DIAGNOSIS — F17210 Nicotine dependence, cigarettes, uncomplicated: Secondary | ICD-10-CM | POA: Diagnosis present

## 2017-11-04 DIAGNOSIS — R1084 Generalized abdominal pain: Secondary | ICD-10-CM | POA: Diagnosis not present

## 2017-11-04 DIAGNOSIS — R740 Nonspecific elevation of levels of transaminase and lactic acid dehydrogenase [LDH]: Secondary | ICD-10-CM | POA: Diagnosis not present

## 2017-11-04 DIAGNOSIS — E039 Hypothyroidism, unspecified: Secondary | ICD-10-CM | POA: Diagnosis present

## 2017-11-04 DIAGNOSIS — F0630 Mood disorder due to known physiological condition, unspecified: Secondary | ICD-10-CM | POA: Diagnosis present

## 2017-11-04 DIAGNOSIS — D649 Anemia, unspecified: Secondary | ICD-10-CM | POA: Diagnosis present

## 2017-11-04 DIAGNOSIS — R748 Abnormal levels of other serum enzymes: Secondary | ICD-10-CM | POA: Diagnosis present

## 2017-11-04 DIAGNOSIS — K811 Chronic cholecystitis: Secondary | ICD-10-CM

## 2017-11-04 DIAGNOSIS — R7989 Other specified abnormal findings of blood chemistry: Secondary | ICD-10-CM | POA: Diagnosis present

## 2017-11-04 DIAGNOSIS — Z91018 Allergy to other foods: Secondary | ICD-10-CM | POA: Diagnosis not present

## 2017-11-04 DIAGNOSIS — F4310 Post-traumatic stress disorder, unspecified: Secondary | ICD-10-CM | POA: Diagnosis present

## 2017-11-04 DIAGNOSIS — B182 Chronic viral hepatitis C: Secondary | ICD-10-CM | POA: Diagnosis present

## 2017-11-04 DIAGNOSIS — N119 Chronic tubulo-interstitial nephritis, unspecified: Secondary | ICD-10-CM | POA: Diagnosis not present

## 2017-11-04 DIAGNOSIS — R945 Abnormal results of liver function studies: Secondary | ICD-10-CM | POA: Diagnosis not present

## 2017-11-04 DIAGNOSIS — Z590 Homelessness: Secondary | ICD-10-CM | POA: Diagnosis not present

## 2017-11-04 DIAGNOSIS — F319 Bipolar disorder, unspecified: Secondary | ICD-10-CM | POA: Diagnosis present

## 2017-11-04 DIAGNOSIS — N12 Tubulo-interstitial nephritis, not specified as acute or chronic: Secondary | ICD-10-CM | POA: Diagnosis present

## 2017-11-04 DIAGNOSIS — Z8249 Family history of ischemic heart disease and other diseases of the circulatory system: Secondary | ICD-10-CM | POA: Diagnosis not present

## 2017-11-04 DIAGNOSIS — R0789 Other chest pain: Secondary | ICD-10-CM | POA: Diagnosis present

## 2017-11-04 DIAGNOSIS — Z884 Allergy status to anesthetic agent status: Secondary | ICD-10-CM | POA: Diagnosis not present

## 2017-11-04 DIAGNOSIS — B009 Herpesviral infection, unspecified: Secondary | ICD-10-CM | POA: Diagnosis present

## 2017-11-04 DIAGNOSIS — F1910 Other psychoactive substance abuse, uncomplicated: Secondary | ICD-10-CM | POA: Diagnosis not present

## 2017-11-04 DIAGNOSIS — R1011 Right upper quadrant pain: Secondary | ICD-10-CM | POA: Diagnosis not present

## 2017-11-04 DIAGNOSIS — K219 Gastro-esophageal reflux disease without esophagitis: Secondary | ICD-10-CM | POA: Diagnosis present

## 2017-11-04 DIAGNOSIS — R112 Nausea with vomiting, unspecified: Secondary | ICD-10-CM | POA: Diagnosis present

## 2017-11-04 DIAGNOSIS — F419 Anxiety disorder, unspecified: Secondary | ICD-10-CM | POA: Diagnosis present

## 2017-11-04 LAB — HEPATITIS PANEL, ACUTE
HCV Ab: >11 s/co ratio — ABNORMAL HIGH (ref 0.0–0.9)
HEP A IGM: NEGATIVE
HEP B C IGM: NEGATIVE
HEP B S AG: NEGATIVE

## 2017-11-04 LAB — PHOSPHORUS: Phosphorus: 3.8 mg/dL (ref 2.5–4.6)

## 2017-11-04 LAB — CBC WITH DIFFERENTIAL/PLATELET
BASOS PCT: 1 %
Basophils Absolute: 0 10*3/uL (ref 0.0–0.1)
EOS ABS: 0.2 10*3/uL (ref 0.0–0.7)
Eosinophils Relative: 5 %
HCT: 37.1 % (ref 36.0–46.0)
HEMOGLOBIN: 12.1 g/dL (ref 12.0–15.0)
Lymphocytes Relative: 31 %
Lymphs Abs: 1.3 10*3/uL (ref 0.7–4.0)
MCH: 27.5 pg (ref 26.0–34.0)
MCHC: 32.6 g/dL (ref 30.0–36.0)
MCV: 84.3 fL (ref 78.0–100.0)
Monocytes Absolute: 0.4 10*3/uL (ref 0.1–1.0)
Monocytes Relative: 10 %
NEUTROS PCT: 53 %
Neutro Abs: 2.2 10*3/uL (ref 1.7–7.7)
Platelets: 167 10*3/uL (ref 150–400)
RBC: 4.4 MIL/uL (ref 3.87–5.11)
RDW: 14.8 % (ref 11.5–15.5)
WBC: 4.1 10*3/uL (ref 4.0–10.5)

## 2017-11-04 LAB — SURGICAL PCR SCREEN
MRSA, PCR: NEGATIVE
STAPHYLOCOCCUS AUREUS: NEGATIVE

## 2017-11-04 LAB — COMPREHENSIVE METABOLIC PANEL
ALBUMIN: 3.5 g/dL (ref 3.5–5.0)
ALK PHOS: 111 U/L (ref 38–126)
ALT: 407 U/L — AB (ref 14–54)
ANION GAP: 7 (ref 5–15)
AST: 203 U/L — ABNORMAL HIGH (ref 15–41)
BUN: 5 mg/dL — ABNORMAL LOW (ref 6–20)
CALCIUM: 8.6 mg/dL — AB (ref 8.9–10.3)
CO2: 24 mmol/L (ref 22–32)
Chloride: 107 mmol/L (ref 101–111)
Creatinine, Ser: 0.62 mg/dL (ref 0.44–1.00)
GFR calc Af Amer: >60 mL/min (ref >60)
GFR calc non Af Amer: >60 mL/min (ref >60)
GLUCOSE: 91 mg/dL (ref 65–99)
Potassium: 3.8 mmol/L (ref 3.5–5.1)
SODIUM: 138 mmol/L (ref 135–145)
Total Bilirubin: 0.8 mg/dL (ref 0.3–1.2)
Total Protein: 6.6 g/dL (ref 6.5–8.1)

## 2017-11-04 LAB — MAGNESIUM: Magnesium: 2.2 mg/dL (ref 1.7–2.4)

## 2017-11-04 MED ORDER — TRAMADOL HCL 50 MG PO TABS
50.0000 mg | ORAL_TABLET | Freq: Four times a day (QID) | ORAL | Status: DC | PRN
  Administered 2017-11-04 – 2017-11-06 (×4): 50 mg via ORAL
  Filled 2017-11-04 (×4): qty 1

## 2017-11-04 NOTE — Progress Notes (Signed)
PROGRESS NOTE    Yesenia Bennett  RCV:[id number] DOB: 05-20-1986 DOA: 11/02/2017 PCP: System, Pcp Not In   Brief Narrative:  HPI Per Dr. Elesa Hacker is a 31 y.o. female with medical history significant of Heroin Abuse, hepatitis C, anxiety, depression, PTSD, and cervical cancer diagnosed 2010; resents with complaints of right upper quadrant pain for the last 2 weeks. Describes the pain as a stabbing and cramping pain mostly in the right upper quadrant that radiates to her shoulder. Eating any food seemed to worsen symptoms.  Associated symptoms include complaints of chills, nausea, nonbloody emesis, poor p.o. intake, heartburn, and dizziness.  Patient was previously seen at [facility] on 12/13 with complaints of nausea and vomiting.  Review of records shows that patient was evaluated and seen to have elevated ALT  267, AST 139, and lipase 41.  It appeared she underwent CT scan which showed no acute abnormalities and abdominal ultrasound which showed a small amount of sludge within the gallbladder and mild splenomegaly.  Notes that she has had she just recently completed Heroin rehab at [facility], and was the reason why she did not stay at when previously evaluated.  Currently, not on any treatment for her previous history of mood disorders.  Upon admission into the emergency room patient was seen to be afebrile with vital signs relatively within normal limits.  Labs revealed AST 166, ALT 424, lipase 88, total bilirubin 0.7, and all other labs relatively within normal limits.  Chest x-ray was unremarkable on right upper quadrant ultrasound showed signs of mild gallbladder wall thickening without signs of gallstones.  She received 2 L of normal saline IV fluids.  General surgery was consulted, but recommended GI evaluation first as does not appear patient needs a emergent cholecystectomy at this time.  [facility] physician on call was consulted and recommended adding on ethanol level, acetaminophen,  hepatitis panel, and INR.  TR[facility] called to admit.  Patient currently still having Pain. Gastroenterology evaluated and recommend against MRCP and recommending HIDA scan and Surgical Consultation. Surgery evaluated and are determining timing of Cholecystectomy.   Assessment & Plan:   Principal Problem:   RUQ pain Active Problems:   Elevated LFTs   Elevated lipase   Urinary urgency   Chronic hepatitis C without hepatic coma (HCC)   Subclinical hypothyroidism   Mood disorder in conditions classified elsewhere   Polysubstance abuse (HCC)  Right Upper Quadrant Abdominal Pain associated with evated Lipase, elevated LFTs - Acute. Patient presents with complaints of worsening right upper quadrant abdominal pain with radiation to her shoulder.   -Lipase was 88 on Admission, AST was 166, ALT was 424 -Ultrasound showed mild signs of gallbladder wall thickening suspected secondary to contraction and no gallstones. Due to the elevation in liver enzymes and lipase question the possibility of a large gallstone.  -Gastroenterology consulted and evaluated and feel like cause of pain is difficult to determine and feel there is no convincing evidence of Acute Cholecystitis and do not feel these are typical for Peptic Ulcer so are not pursuing further endoscopic procedures.  -Admitted to a MedSurg bed -Continue to Monitor intake and output -Decreased Normal Saline at 125 mL/hr to -Morphine IV prn pain D/C'd and started IV Ketorolac  -Acetaminophen Level was <10, PT-INR was 16.6-1.35, and EtOH level was <10  -Gastroenterology recommending against MRCP and recommend obtaining a HIDA Scan -HIDA showed No evidence of cystic duct obstruction to confirm acute cholecystitis. The gallbladder is visible after IV morphine administration. Nonvisualization  of the gallbladder out to 1 hour 20 minutes prior to IV morphine administration most likely indicates chronic cholecystitis.  Mild tracer retention by the liver  indicating mild  Hepatocellular disease, which is compatible with the given history of hepatitis-C and elevated liver function tests -General Surgery evaluated and are determining the timing of the Cholecystectomy  -Hx of being on  Suboxone 8-2 mg Film SL BID -Diet advanced to Clear Liquid Diet; Make NPO at midnight  -Repeat CMP in AM   Urinary Urgency -Acute.  Patient reports having urinary urgency and pressure symptoms. -Checked Urinalysis and showed Hazy Appearance, Amber Color, Large Hb, 6-30 RBC, 0-5 WBC and Negative Leukocytes and Nitrites -Continue to Monitor   Hepatitis C -Records show diagnosed with hepatitis C Genotype  1a  back in 2015-2016, and has not had treatment for this at this time.  -AST went from 166 -> 129 -> 203 -ALT went from 424 -> 337 -> 407 -Follow-up Repeat Hepatitis Panel shows Hep C Ab >11.0 -Per Gastroenterology not a Candidate for Treatment currently but will need to be referred to Infectious Disease Clinic after D/C   History of Subclinical Hypothyroidism -Previously documented back in 2016. -Checked TSH and was 0.404  History of Polysubstance Abuse including Opioid Abuse  -Previous history of Heroin Abuse -Avoid Narcotics and D/C'd IV Morphine and started IV Ketorolac for Patient; Will need to obtain records from [facility] if patient still sees them -Still current uses  tobacco, and unclear about other drugs. -C/w Nicotine Patch 21 mg TD q24h  Mood Disorder History -Patient with previous history of PTSD, Anxiety, Depression, other diagnoses -Currently not on any treatment for these at this time but review shows she has been on Citalopram 40 mg po Daily and Oxcarbazepine 300 mg po BID  Normocytic Anemia -Likely dilutional Drop though Urinalysis did show some blood -Hb/Hct went from 12.7/38.0 -> 11.3/35.3 -> 12.1/37.1 -Continue to Monitor for S/Sx of Bleeding as patient has Hx of Cervical Cancer and Hematuria with Aspirin, and Ibuprofen    -Repeat CBC in AM   Hx of Cervical Cancer -Follow up with Oncology as an outpatient  Hx of Herpes Simplex -Review of Records from [place] show patient has been on Valacyclovir in the past  DVT prophylaxis: Enoxaparin 40 mg sq q24h Code Status: FULL CODE Family Communication: Discussed with boyfriend at bedside Disposition Plan: Anticipate D/C after Surgical Removal of Gallbladder with timing to be determined by General Surgery  Consultants:   Gastroenterology Dr. Wilfrid Lund  General Surgery Dr. Gurney Maxin    Procedures:  HIDA Scan (Patient currently undergoing)   Antimicrobials:  Anti-infectives (From admission, onward)   None     Subjective: Seen and examined at bedside and still complaining of abdominal pain. No CP or SOB but felt a little nauseous and felt hungry.   Objective: Vitals:   11/03/17 1407 11/03/17 1458 11/03/17 2106 11/04/17 0534  BP: (!) 144/94  119/78 (!) 113/58  Pulse: (!) 54  (!) 55 73  Resp: 18  17 18   Temp: 98.8 F (37.1 C) 99.2 F (37.3 C) 98.5 F (36.9 C) 98.6 F (37 C)  TempSrc: Oral Oral Oral   SpO2: 100%  100% 100%  Weight:      Height:        Intake/Output Summary (Last 24 hours) at 11/04/2017 1336 Last data filed at 11/04/2017 0535 Gross per 24 hour  Intake 0 ml  Output -  Net 0 ml   [employer]  11/02/17 1822 11/03/17 0035  Weight: 72.6 kg (160 lb) 74.6 kg (164 lb 7.4 oz)   Examination: Physical Exam:  Constitutional: WN/WD Caucasian female in NAD appears calm Eyes: Sclerae anicteric. Lids normal ENMT: Poor dentition. External Ears and nose appear normal Neck: Supple with no JVD Respiratory: Slightly diminished but no appreciable wheezing/rales/rhonchi Cardiovascular: RRR; No appreciable m/r/g. No extremity edema Abdomen: Soft, Tender to palpate. ND. Bowel sounds present GU: Deferred Musculoskeletal: No clubbing or cyanosis Skin: Warm and dry. No rashes or lesions on a limited skin eval Neurologic: CN 2-12  grossly intact. No appreciable focal deficits Psychiatric: Depressed appearing mood. Normal judgement and insight  Data Reviewed: I have personally reviewed following labs and imaging studies  CBC: Recent Labs  Lab 11/02/17 2021 11/03/17 0709 11/04/17 0758  WBC 6.4 4.6 4.1  NEUTROABS  --   --  2.2  HGB 12.7 11.3* 12.1  HCT 38.0 35.3* 37.1  MCV 85.6 86.7 84.3  PLT 218 167 254   Basic Metabolic Panel: Recent Labs  Lab 11/02/17 2021 11/03/17 0709 11/04/17 0758  NA 140 140 138  K 3.5 3.8 3.8  CL 106 112* 107  CO2 27 23 24   GLUCOSE 98 102* 91  BUN 7 6 5*  CREATININE 0.67 0.70 0.62  CALCIUM 8.9 8.1* 8.6*  MG  --   --  2.2  PHOS  --   --  3.8   GFR: Estimated Creatinine Clearance: 107.5 mL/min (by C-G formula based on SCr of 0.62 mg/dL). Liver Function Tests: Recent Labs  Lab 11/02/17 2021 11/03/17 0709 11/04/17 0758  AST 166* 129* 203*  ALT 424* 337* 407*  ALKPHOS 123 91 111  BILITOT 0.7 0.8 0.8  PROT 7.1 6.0* 6.6  ALBUMIN 3.7 3.3* 3.5   Recent Labs  Lab 11/02/17 2021  LIPASE 88*   No results for input(s): AMMONIA in the last 168 hours. Coagulation Profile: Recent Labs  Lab 11/02/17 2155  INR 1.35   Cardiac Enzymes: No results for input(s): CKTOTAL, CKMB, CKMBINDEX, TROPONINI in the last 168 hours. BNP (last 3 results) No results for input(s): PROBNP in the last 8760 hours. HbA1C: No results for input(s): HGBA1C in the last 72 hours. CBG: No results for input(s): GLUCAP in the last 168 hours. Lipid Profile: No results for input(s): CHOL, HDL, LDLCALC, TRIG, CHOLHDL, LDLDIRECT in the last 72 hours. Thyroid Function Tests: Recent Labs    11/03/17 0709  TSH 0.404   Anemia Panel: No results for input(s): VITAMINB12, FOLATE, FERRITIN, TIBC, IRON, RETICCTPCT in the last 72 hours. Sepsis Labs: No results for input(s): PROCALCITON, LATICACIDVEN in the last 168 hours.  No results found for this or any previous visit (from the past 240 hour(s)).    Radiology Studies: Dg Chest 2 View  Result Date: 11/02/2017 CLINICAL DATA:  31 year old with cough and shortness of breath. EXAM: CHEST  2 VIEW COMPARISON:  None. FINDINGS: The heart size and mediastinal contours are within normal limits. Both lungs are clear. The visualized skeletal structures are unremarkable. IMPRESSION: No active cardiopulmonary disease. Electronically Signed   By: Markus Daft M.D.   On: 11/02/2017 20:43   Nm Hepatobiliary Liver Func  Result Date: 11/03/2017 CLINICAL DATA:  31 year old with current history of hepatitis-C, admitted yesterday for a 10 day history of right upper quadrant abdominal pain. Elevated LFTs. Right upper quadrant abdominal ultrasound yesterday demonstrated a contracted gallbladder without evidence of cholelithiasis. EXAM: NUCLEAR MEDICINE HEPATOBILIARY IMAGING TECHNIQUE: Sequential images of the abdomen were obtained out to  100 minutes following intravenous administration of radiopharmaceutical. At that point, 3 mg of morphine was administered intravenously and imaging was performed for another 30 minutes. RADIOPHARMACEUTICALS:  4.61 mCi Tc-57m  Choletec IV. COMPARISON:  No prior nuclear imaging. Right upper quadrant abdominal ultrasound performed yesterday is correlated. FINDINGS: Hepatic uptake is normal. Intrahepatic bile ducts are visible within 15 minutes. The common hepatic duct and common bile duct are visible within 20 minutes. Small bowel activity is visible within 20 minutes. Gallbladder activity is not visible out to 100 minutes. There is mild tracer retention by the liver. After administration of intravenous morphine, gallbladder activity is visible. IMPRESSION: 1. No evidence of cystic duct obstruction to confirm acute cholecystitis. The gallbladder is visible after IV morphine administration. 2. Nonvisualization of the gallbladder out to 1 hour 20 minutes prior to IV morphine administration most likely indicates chronic cholecystitis. 3. Mild  tracer retention by the liver indicating mild hepatocellular disease, which is compatible with the given history of hepatitis-C and elevated liver function tests. Electronically Signed   By: Evangeline Dakin M.D.   On: 11/03/2017 19:48   US Abdomen Limited Ruq  Result Date: 11/02/2017 CLINICAL DATA:  Right upper quadrant pain and vomiting. EXAM: ULTRASOUND ABDOMEN LIMITED RIGHT UPPER QUADRANT COMPARISON:  None. FINDINGS: Gallbladder: Gallbladder is small and contracted. Gallbladder wall is slightly prominent measuring 0.3 cm. Negative for gallstones. Patient does not have a sonographic Murphy sign. Common bile duct: Diameter: 0.3 cm. Liver: No focal lesion identified. Within normal limits in parenchymal echogenicity. Portal vein is patent on color Doppler imaging with normal direction of blood flow towards the liver. Other:  Large amount of gas in the region of the stomach. IMPRESSION: No acute abnormality. Gallbladder wall is mildly thickened and probably due to the gallbladder contraction. No gallstones. Electronically Signed   By: Markus Daft M.D.   On: 11/02/2017 21:13   Scheduled Meds: . enoxaparin (LOVENOX) injection  40 mg Subcutaneous Daily  . nicotine  21 mg Transdermal Daily   Continuous Infusions: . sodium chloride 75 mL/hr at 11/04/17 1054    LOS: 0 days   Kerney Elbe, DO [facility] Pager [phone]  If 7PM-7AM, please contact night-coverage [email or web address] Password [email or web address] 11/04/2017, 1:36 PM

## 2017-11-04 NOTE — Progress Notes (Signed)
[facility] Progress Note  Chief Complaint: Right upper quadrant pain  Subjective  History:  Patient has right upper quadrant pain much the same as yesterday. HIDA scan showed very delayed filling of the gallbladder laterally after morphine administration. Has been seen in surgical consultation and timing of cholecystectomy is being considered. Appetite has been poor.  ROS: Cardiovascular:  no chest pain Respiratory: no dyspnea  Objective:  Med list reviewed  Vital signs in last 24 hrs: Vitals:   11/04/17 0534 11/04/17 1336  BP: (!) 113/58 120/71  Pulse: 73 (!) 55  Resp: 18 18  Temp: 98.6 F (37 C) 98.9 F (37.2 C)  SpO2: 100% 100%    Physical Exam Chronically ill-appearing woman, no acute distress, nontoxic. More comfortable in yesterday.  HEENT: sclera anicteric, oral mucosa moist without lesions  Neck: supple, no thyromegaly, JVD or lymphadenopathy  Cardiac: RRR without murmurs, S1S2 heard, no peripheral edema  Pulm: clear to auscultation bilaterally, normal RR and effort noted  Abdomen: soft, epigastric and right upper quadrant tenderness again with a fairly exaggerated reaction to light palpation of the abdominal wall, with active bowel sounds. No guarding or palpable hepatosplenomegaly  Skin; warm and dry, no jaundice or rash  Recent Labs:  Recent Labs  Lab 11/02/17 2021 11/03/17 0709 11/04/17 0758  WBC 6.4 4.6 4.1  HGB 12.7 11.3* 12.1  HCT 38.0 35.3* 37.1  PLT 218 167 167   Recent Labs  Lab 11/04/17 0758  NA 138  K 3.8  CL 107  CO2 24  BUN 5*  ALBUMIN 3.5  ALKPHOS 111  ALT 407*  AST 203*  GLUCOSE 91   Recent Labs  Lab 11/02/17 2155  INR 1.35    Radiologic studies:  HIDA scan as noted above @ASSESSMENTPLANBEGIN @ Assessment:  Right upper quadrant pain, imaging consistent with chronic cholecystitis Abnormal LFTs from underlying hepatitis C infection. I do not think she has a retained common bile duct  stone.   Plan: Cholecystectomy timing per the surgical service She should be referred to the infectious disease clinic after to consider treatment of her hepatitis C.  Signing off, call as needed   Nelida Meuse III Pager [phone] Mon-Fri 8a-5p [phone] after 5p, weekends, holidays
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
## 2017-11-05 DIAGNOSIS — K811 Chronic cholecystitis: Principal | ICD-10-CM

## 2017-11-05 LAB — CBC WITH DIFFERENTIAL/PLATELET
Basophils Absolute: 0 10*3/uL (ref 0.0–0.1)
Basophils Relative: 1 %
EOS PCT: 4 %
Eosinophils Absolute: 0.2 10*3/uL (ref 0.0–0.7)
HEMATOCRIT: 36.1 % (ref 36.0–46.0)
Hemoglobin: 12.1 g/dL (ref 12.0–15.0)
LYMPHS ABS: 1.9 10*3/uL (ref 0.7–4.0)
LYMPHS PCT: 40 %
MCH: 28.2 pg (ref 26.0–34.0)
MCHC: 33.5 g/dL (ref 30.0–36.0)
MCV: 84.1 fL (ref 78.0–100.0)
MONO ABS: 0.3 10*3/uL (ref 0.1–1.0)
MONOS PCT: 7 %
NEUTROS ABS: 2.2 10*3/uL (ref 1.7–7.7)
Neutrophils Relative %: 48 %
PLATELETS: 166 10*3/uL (ref 150–400)
RBC: 4.29 MIL/uL (ref 3.87–5.11)
RDW: 14.9 % (ref 11.5–15.5)
WBC: 4.6 10*3/uL (ref 4.0–10.5)

## 2017-11-05 LAB — COMPREHENSIVE METABOLIC PANEL
ALT: 404 U/L — ABNORMAL HIGH (ref 14–54)
ANION GAP: 9 (ref 5–15)
AST: 186 U/L — AB (ref 15–41)
Albumin: 3.3 g/dL — ABNORMAL LOW (ref 3.5–5.0)
Alkaline Phosphatase: 110 U/L (ref 38–126)
BILIRUBIN TOTAL: 0.7 mg/dL (ref 0.3–1.2)
BUN: 5 mg/dL — AB (ref 6–20)
CO2: 22 mmol/L (ref 22–32)
Calcium: 8.4 mg/dL — ABNORMAL LOW (ref 8.9–10.3)
Chloride: 106 mmol/L (ref 101–111)
Creatinine, Ser: 0.59 mg/dL (ref 0.44–1.00)
Glucose, Bld: 96 mg/dL (ref 65–99)
POTASSIUM: 3.7 mmol/L (ref 3.5–5.1)
Sodium: 137 mmol/L (ref 135–145)
TOTAL PROTEIN: 6.6 g/dL (ref 6.5–8.1)

## 2017-11-05 LAB — PHOSPHORUS: PHOSPHORUS: 3.9 mg/dL (ref 2.5–4.6)

## 2017-11-05 LAB — MAGNESIUM: MAGNESIUM: 2.1 mg/dL (ref 1.7–2.4)

## 2017-11-05 MED ORDER — ALBUTEROL SULFATE (2.5 MG/3ML) 0.083% IN NEBU: 2.5000 mg | INHALATION_SOLUTION | RESPIRATORY_TRACT | Status: DC | PRN

## 2017-11-05 MED ORDER — MORPHINE SULFATE (PF) 4 MG/ML IV SOLN
2.0000 mg | INTRAVENOUS | Status: DC | PRN
  Administered 2017-11-05: 2 mg via INTRAVENOUS
  Filled 2017-11-05: qty 1

## 2017-11-05 MED ORDER — DEXTROSE 5 % IV SOLN
2.0000 g | INTRAVENOUS | Status: DC
  Administered 2017-11-05 – 2017-11-06 (×2): 2 g via INTRAVENOUS
  Filled 2017-11-05 (×2): qty 2

## 2017-11-05 MED ORDER — MORPHINE SULFATE (PF) 4 MG/ML IV SOLN
2.0000 mg | Freq: Four times a day (QID) | INTRAVENOUS | Status: DC | PRN
  Administered 2017-11-05 – 2017-11-06 (×5): 2 mg via INTRAVENOUS
  Filled 2017-11-05 (×6): qty 1

## 2017-11-05 MED ORDER — DEXTROSE 5 % IV SOLN: 2.0000 g | INTRAVENOUS | Status: DC

## 2017-11-05 NOTE — Progress Notes (Signed)
[facility] Progress Note     Subjective: CC: RUQ pain Patient with RUQ pain slightly worsened over course of yesterday. Pain radiating to right back. Some nausea, no vomiting. Feels hungry. Concerned about pain management post-op. UOP good. VSS.   Objective: Vital signs in last 24 hours: Temp:  [98.2 F (36.8 C)-98.9 F (37.2 C)] 98.2 F (36.8 C) (12/26 0435) Pulse Rate:  [50-55] 50 (12/26 0435) Resp:  [18-19] 19 (12/26 0435) BP: (107-120)/(66-72) 107/66 (12/26 0435) SpO2:  [99 %-100 %] 99 % (12/26 0435) Last BM Date: 11/04/17  Intake/Output from previous day: 12/25 0701 - 12/26 0700 In: 600 [I.V.:600] Out: -  Intake/Output this shift: No intake/output data recorded.  PE: Gen:  Alert, NAD, pleasant Card:  Regular rate and rhythm, pedal pulses 2+ BL Pulm:  Normal effort, clear to auscultation bilaterally Abd: Soft, TTP of RUQ, non-distended, bowel sounds present, no HSM Skin: warm and dry, no rashes  Psych: A&Ox3   Lab Results:  Recent Labs    11/04/17 0758 11/05/17 0527  WBC 4.1 4.6  HGB 12.1 12.1  HCT 37.1 36.1  PLT 167 166   BMET Recent Labs    11/04/17 0758 11/05/17 0527  NA 138 137  K 3.8 3.7  CL 107 106  CO2 24 22  GLUCOSE 91 96  BUN 5* 5*  CREATININE 0.62 0.59  CALCIUM 8.6* 8.4*   PT/INR Recent Labs    11/02/17 2155  LABPROT 16.6*  INR 1.35   CMP     Component Value Date/Time   NA 137 11/05/2017 0527   K 3.7 11/05/2017 0527   CL 106 11/05/2017 0527   CO2 22 11/05/2017 0527   GLUCOSE 96 11/05/2017 0527   BUN 5 (L) 11/05/2017 0527   CREATININE 0.59 11/05/2017 0527   CALCIUM 8.4 (L) 11/05/2017 0527   PROT 6.6 11/05/2017 0527   ALBUMIN 3.3 (L) 11/05/2017 0527   AST 186 (H) 11/05/2017 0527   ALT 404 (H) 11/05/2017 0527   ALKPHOS 110 11/05/2017 0527   BILITOT 0.7 11/05/2017 0527   GFRNONAA >60 11/05/2017 0527   GFRAA >60 11/05/2017 0527   Lipase     Component Value Date/Time   LIPASE 88 (H) 11/02/2017 2021        Studies/Results: Nm Hepatobiliary Liver Func  Result Date: 11/03/2017 CLINICAL DATA:  30 year old with current history of hepatitis-C, admitted yesterday for a 10 day history of right upper quadrant abdominal pain. Elevated LFTs. Right upper quadrant abdominal ultrasound yesterday demonstrated a contracted gallbladder without evidence of cholelithiasis. EXAM: NUCLEAR MEDICINE HEPATOBILIARY IMAGING TECHNIQUE: Sequential images of the abdomen were obtained out to 100 minutes following intravenous administration of radiopharmaceutical. At that point, 3 mg of morphine was administered intravenously and imaging was performed for another 30 minutes. RADIOPHARMACEUTICALS:  4.61 mCi Tc-43m Choletec IV. COMPARISON:  No prior nuclear imaging. Right upper quadrant abdominal ultrasound performed yesterday is correlated. FINDINGS: Hepatic uptake is normal. Intrahepatic bile ducts are visible within 15 minutes. The common hepatic duct and common bile duct are visible within 20 minutes. Small bowel activity is visible within 20 minutes. Gallbladder activity is not visible out to 100 minutes. There is mild tracer retention by the liver. After administration of intravenous morphine, gallbladder activity is visible. IMPRESSION: 1. No evidence of cystic duct obstruction to confirm acute cholecystitis. The gallbladder is visible after IV morphine administration. 2. Nonvisualization of the gallbladder out to 1 hour 20 minutes prior to IV morphine administration most likely indicates chronic  cholecystitis. 3. Mild tracer retention by the liver indicating mild hepatocellular disease, which is compatible with the given history of hepatitis-C and elevated liver function tests. Electronically Signed   By: Evangeline Dakin M.D.   On: 11/03/2017 19:48    Anti-infectives: Anti-infectives (From admission, onward)   None       Assessment/Plan Hepatitis C Subclinical hypothyroidism PSA including opioids Mood disorder  hx Normocytic anemia Hx of cervical cancer Hx of herpes simplex Urinary urgency  RUQ abdominal pain with elevated lipase and elevated LFTs - lipase 88 on 12/23 - AST/ALT 186/404, Tbili 0.7, Alk Phos 110 Chronic cholecystitis - HIDA gallbladder did not fill until morphine administration concerning for chronic cholecystitis - WBC 4.6, afebrile - start abx today with worsened pain, will likely d/c post-op - pain control - unable to do laparoscopic cholecystectomy today, will pre-op and plan for tomorrow  FEN: NPO after midnight VTE: SCDs, hold lovenox tomorrow ID: no current abx  Plan: Start abx. OR tomorrow for laparoscopic cholecystectomy   LOS: 1 day    Brigid Re , [facility] 11/05/2017, 8:10 AM Pager: [phone] Consults: [phone] Mon-Fri 7:00 am-4:30 pm Sat-Sun 7:00 am-11:30 am

## 2017-11-05 NOTE — Progress Notes (Signed)
Patient c/o RUQ sharp abdominal pain not relieved by pain med but noticed patient to be sound asleep after an hour of administering Ultram 50 mg at 2243.Md on call paged. Waiting for reply.

## 2017-11-05 NOTE — Progress Notes (Signed)
PROGRESS NOTE    Yesenia Bennett  YNW:[id number] DOB: 1985-11-14 DOA: 11/02/2017 PCP: System, Pcp Not In   Brief Narrative:  HPI Per Dr. Elesa Hacker is a 31 y.o. female with medical history significant of Heroin Abuse, hepatitis C, anxiety, depression, PTSD, and cervical cancer diagnosed 2010; resents with complaints of right upper quadrant pain for the last 2 weeks. Describes the pain as a stabbing and cramping pain mostly in the right upper quadrant that radiates to her shoulder. Eating any food seemed to worsen symptoms.  Associated symptoms include complaints of chills, nausea, nonbloody emesis, poor p.o. intake, heartburn, and dizziness.  Patient was previously seen at [facility] on 12/13 with complaints of nausea and vomiting.  Review of records shows that patient was evaluated and seen to have elevated ALT  267, AST 139, and lipase 41.  It appeared she underwent CT scan which showed no acute abnormalities and abdominal ultrasound which showed a small amount of sludge within the gallbladder and mild splenomegaly.  Notes that she has had she just recently completed Heroin rehab at [facility], and was the reason why she did not stay at when previously evaluated.  Currently, not on any treatment for her previous history of mood disorders.  Upon admission into the emergency room patient was seen to be afebrile with vital signs relatively within normal limits.  Labs revealed AST 166, ALT 424, lipase 88, total bilirubin 0.7, and all other labs relatively within normal limits.  Chest x-ray was unremarkable on right upper quadrant ultrasound showed signs of mild gallbladder wall thickening without signs of gallstones.  She received 2 L of normal saline IV fluids.  General surgery was consulted, but recommended GI evaluation first as does not appear patient needs a emergent cholecystectomy at this time.  [place] [facility] physician on call was consulted and recommended adding on ethanol level, acetaminophen,  hepatitis panel, and INR.  TRH called to admit.  Patient currently still having RUQ Pain. Gastroenterology evaluated and recommend against MRCP and recommending HIDA scan and Surgical Consultation. Surgery evaluated and are determining timing of Cholecystectomy and patient to undergo Lap Chole in AM.   Assessment & Plan:   Principal Problem:   RUQ pain Active Problems:   Elevated LFTs   Elevated lipase   Urinary urgency   Chronic hepatitis C without hepatic coma (HCC)   Subclinical hypothyroidism   Mood disorder in conditions classified elsewhere   Polysubstance abuse (HCC)   Chronic cholecystitis  Right Upper Quadrant Abdominal Pain associated with evated Lipase, elevated LFTs in the setting of Chronic Cholecystitis  -Patient presented with complaints of worsening right upper quadrant abdominal pain with radiation to her shoulder.   -Lipase was 88 on Admission, AST was 166, ALT was 424 -Ultrasound showed mild signs of gallbladder wall thickening suspected secondary to contraction and no gallstones. Due to the elevation in liver enzymes and lipase question the possibility of a large gallstone.  -Gastroenterology consulted and evaluated and feel like cause of pain is difficult to determine and feel there is no convincing evidence of Acute Cholecystitis and do not feel these are typical for Peptic Ulcer so are not pursuing further endoscopic procedures.  -Admitted to a MedSurg bed -Continue to Monitor intake and output -Decreased Normal Saline at 125 mL/hr to 75 mL/hr yesterday and will D/C today  -Morphine IV prn pain had been D/C'd and started IV Ketorolac (now expired) but IV Morphine restarted by General Surgery Team  -Acetaminophen Level was <10, PT-INR was  16.6-1.35, and EtOH level was <10  -Gastroenterology recommending against MRCP and recommend obtaining a HIDA Scan -HIDA showed No evidence of cystic duct obstruction to confirm acute cholecystitis. The gallbladder is visible after  IV morphine administration. Nonvisualization of the gallbladder out to 1 hour 20 minutes prior to IV morphine administration most likely indicates chronic cholecystitis.  Mild tracer retention by the liver indicating mild  Hepatocellular disease, which is compatible with the given history of hepatitis-C and elevated liver function tests -General Surgery evaluated and are determining the timing of the Cholecystectomy; Patient to undergo Lap Cholecystectomy in AM and will need to be NPO at [place] -Hx of being on Suboxone 8-2 mg Film SL BID and has been a Heroin Addict  -General Surgery started patient on IV Morphine, Tramadol 50 mg po q6hprn for moderate pain, and also started IV Abx with Ceftriaxone 2 grams q24h -Diet now advanced to Regular Diet and will be NPO at [place] -Repeat CMP in AM   Urinary Urgency -Acute.  Patient reports having urinary urgency and pressure symptoms. -Checked Urinalysis and showed Hazy Appearance, Amber Color, Large Hb, 6-30 RBC, 0-5 WBC and Negative Leukocytes and Nitrites -Continue to Monitor   Hepatitis C -Records show diagnosed with hepatitis C Genotype  1a  back in 2015-2016, and has not had treatment for this at this time.  -AST went from 166 -> 129 -> 203 -> 186 -ALT went from 424 -> 337 -> 407 -> 404 -Follow-up Repeat Hepatitis Panel shows Hep C Ab >11.0 -Per Gastroenterology not a Candidate for Treatment currently but will need to be referred to Infectious Disease Clinic after D/C   History of Subclinical Hypothyroidism -Previously documented back in 2016. -Checked TSH and was 0.404  History of Polysubstance Abuse including Opioid Abuse  -Previous history of Heroin Abuse -Avoid Narcotics and D/C'd IV Morphine and started IV Ketorolac for Patient however General Surgery started patient on IV Morphine 2 mg IV q2hprn but will change to IV q6hprn; Will need to obtain records from [facility] if patient still sees them -General Surgery also started  Tramadol 50 mg po q6hprn -Still current uses  tobacco, and unclear about other drugs. -C/w Nicotine Patch 21 mg TD q24h for Tobacco Abuse  Mood Disorder History -Patient with previous history of PTSD, Anxiety, Depression, other diagnoses -Currently not on any treatment for these at this time but review shows she has been on Citalopram 40 mg po Daily and Oxcarbazepine 300 mg po BID  Normocytic Anemia -Likely dilutional Drop though Urinalysis did show some blood -Hb/Hct went from 12.7/38.0 -> 11.3/35.3 -> 12.1/37.1 -> 12.1/36.1 -Continue to Monitor for S/Sx of Bleeding as patient has Hx of Cervical Cancer and Hematuria with Aspirin, and Ibuprofen  -Repeat CBC in AM   Hx of Cervical Cancer -Follow up with Oncology as an outpatient  Hx of Herpes Simplex -Review of Records from [place] show patient has been on Valacyclovir in the past  DVT prophylaxis: Enoxaparin 40 mg sq q24h Code Status: FULL CODE Family Communication: Discussed with boyfriend at bedside Disposition Plan: Will remain Inpatient for Surgery in AM   Consultants:   Gastroenterology  General Surgery    Procedures:  HIDA Scan    Antimicrobials:  Anti-infectives (From admission, onward)   Start     Dose/Rate Route Frequency Ordered Stop   11/06/17 0600  cefTRIAXone (ROCEPHIN) 2 g in dextrose 5 % 50 mL IVPB  Status:  Discontinued     2 g 100 mL/hr over 30  Minutes Intravenous On call to O.R. 11/05/17 6962 11/05/17 0900   11/05/17 1000  cefTRIAXone (ROCEPHIN) 2 g in dextrose 5 % 50 mL IVPB     2 g 100 mL/hr over 30 Minutes Intravenous Every 24 hours 11/05/17 0900       Subjective: Seen and examined at bedside and still states she has pain but it was better controlled with Tramadol. Still felt nauseous. No CP or SOB. No other complaints or concerns at this time.   Objective: Vitals:   11/04/17 1336 11/04/17 2140 11/05/17 0435 11/05/17 1328  BP: 120/71 119/72 107/66 122/70  Pulse: (!) 55 (!) 51 (!) 50 (!) 56    Resp: 18 18 19    Temp: 98.9 F (37.2 C) 98.3 F (36.8 C) 98.2 F (36.8 C) 98.7 F (37.1 C)  TempSrc: Oral Oral Oral Oral  SpO2: 100% 99% 99% 99%  Weight:      Height:        Intake/Output Summary (Last 24 hours) at 11/05/2017 1350 Last data filed at 11/05/2017 1331 Gross per 24 hour  Intake 1060 ml  Output -  Net 1060 ml   Filed Weights   11/02/17 1822 11/03/17 0035  Weight: 72.6 kg (160 lb) 74.6 kg (164 lb 7.4 oz)   Examination: Physical Exam:  Constitutional: WN/WD Caucasian female in NAD appears calm Eyes: Sclerae anicteric. Lids normal. ENMT: External Ears and nose appear normal. MMM. Poor dentition  Neck: Supple with no JVD; External Ears and nose appear normal Respiratory: CTAB; No appreciable wheezing/rales/rhonchi Cardiovascular: RRR; No appreciable m/r/g. No extremity edema Abdomen: Soft, Tender to palpate especially in RUQ. ND. Bowel sounds present x4 GU: Deferred Musculoskeletal: No contractures; No cyanosis Skin: Warm and dry. No rashes or lesions on a limited skin eval Neurologic: CN 2-12 grossly intact. No appreciable focal deficits Psychiatric: Normal mood and but flat appearing affect. Intact judgement and insight  Data Reviewed: I have personally reviewed following labs and imaging studies  CBC: Recent Labs  Lab 11/02/17 2021 11/03/17 0709 11/04/17 0758 11/05/17 0527  WBC 6.4 4.6 4.1 4.6  NEUTROABS  --   --  2.2 2.2  HGB 12.7 11.3* 12.1 12.1  HCT 38.0 35.3* 37.1 36.1  MCV 85.6 86.7 84.3 84.1  PLT 218 167 167 952   Basic Metabolic Panel: Recent Labs  Lab 11/02/17 2021 11/03/17 0709 11/04/17 0758 11/05/17 0527  NA 140 140 138 137  K 3.5 3.8 3.8 3.7  CL 106 112* 107 106  CO2 27 23 24 22   GLUCOSE 98 102* 91 96  BUN 7 6 5* 5*  CREATININE 0.67 0.70 0.62 0.59  CALCIUM 8.9 8.1* 8.6* 8.4*  MG  --   --  2.2 2.1  PHOS  --   --  3.8 3.9   GFR: Estimated Creatinine Clearance: 107.5 mL/min (by C-G formula based on SCr of 0.59  mg/dL). Liver Function Tests: Recent Labs  Lab 11/02/17 2021 11/03/17 0709 11/04/17 0758 11/05/17 0527  AST 166* 129* 203* 186*  ALT 424* 337* 407* 404*  ALKPHOS 123 91 111 110  BILITOT 0.7 0.8 0.8 0.7  PROT 7.1 6.0* 6.6 6.6  ALBUMIN 3.7 3.3* 3.5 3.3*   Recent Labs  Lab 11/02/17 2021  LIPASE 88*   No results for input(s): AMMONIA in the last 168 hours. Coagulation Profile: Recent Labs  Lab 11/02/17 2155  INR 1.35   Cardiac Enzymes: No results for input(s): CKTOTAL, CKMB, CKMBINDEX, TROPONINI in the last 168 hours. BNP (last 3 results)  No results for input(s): PROBNP in the last 8760 hours. HbA1C: No results for input(s): HGBA1C in the last 72 hours. CBG: No results for input(s): GLUCAP in the last 168 hours. Lipid Profile: No results for input(s): CHOL, HDL, LDLCALC, TRIG, CHOLHDL, LDLDIRECT in the last 72 hours. Thyroid Function Tests: Recent Labs    11/03/17 0709  TSH 0.404   Anemia Panel: No results for input(s): VITAMINB12, FOLATE, FERRITIN, TIBC, IRON, RETICCTPCT in the last 72 hours. Sepsis Labs: No results for input(s): PROCALCITON, LATICACIDVEN in the last 168 hours.  Recent Results (from the past 240 hour(s))  Surgical pcr screen     Status: None   Collection Time: 11/04/17  8:37 PM  Result Value Ref Range Status   MRSA, PCR NEGATIVE NEGATIVE Final   Staphylococcus aureus NEGATIVE NEGATIVE Final    Comment: (NOTE) The Xpert SA Assay (FDA approved for NASAL specimens in patients 31 years of age and older), is one component of a comprehensive surveillance program. It is not intended to diagnose infection nor to guide or monitor treatment.     Radiology Studies: Nm Hepatobiliary Liver Func  Result Date: 11/03/2017 CLINICAL DATA:  31 year old with current history of hepatitis-C, admitted yesterday for a 10 day history of right upper quadrant abdominal pain. Elevated LFTs. Right upper quadrant abdominal ultrasound yesterday demonstrated a  contracted gallbladder without evidence of cholelithiasis. EXAM: NUCLEAR MEDICINE HEPATOBILIARY IMAGING TECHNIQUE: Sequential images of the abdomen were obtained out to 100 minutes following intravenous administration of radiopharmaceutical. At that point, 3 mg of morphine was administered intravenously and imaging was performed for another 30 minutes. RADIOPHARMACEUTICALS:  4.61 mCi Tc-63m  Choletec IV. COMPARISON:  No prior nuclear imaging. Right upper quadrant abdominal ultrasound performed yesterday is correlated. FINDINGS: Hepatic uptake is normal. Intrahepatic bile ducts are visible within 15 minutes. The common hepatic duct and common bile duct are visible within 20 minutes. Small bowel activity is visible within 20 minutes. Gallbladder activity is not visible out to 100 minutes. There is mild tracer retention by the liver. After administration of intravenous morphine, gallbladder activity is visible. IMPRESSION: 1. No evidence of cystic duct obstruction to confirm acute cholecystitis. The gallbladder is visible after IV morphine administration. 2. Nonvisualization of the gallbladder out to 1 hour 20 minutes prior to IV morphine administration most likely indicates chronic cholecystitis. 3. Mild tracer retention by the liver indicating mild hepatocellular disease, which is compatible with the given history of hepatitis-C and elevated liver function tests. Electronically Signed   By: Evangeline Dakin M.D.   On: 11/03/2017 19:48   Scheduled Meds: . enoxaparin (LOVENOX) injection  40 mg Subcutaneous Daily  . nicotine  21 mg Transdermal Daily   Continuous Infusions: . sodium chloride 75 mL/hr at 11/05/17 0600  . cefTRIAXone (ROCEPHIN)  IV Stopped (11/05/17 1008)    LOS: 1 day   Kerney Elbe, DO [facility] Pager [phone]  If 7PM-7AM, please contact night-coverage [email or web address] Password [email or web address] 11/05/2017, 1:50 PM

## 2017-11-06 ENCOUNTER — Inpatient Hospital Stay (HOSPITAL_COMMUNITY): Payer: Medicaid Other | Admitting: Certified Registered"

## 2017-11-06 ENCOUNTER — Encounter (HOSPITAL_COMMUNITY): Payer: Self-pay | Admitting: Orthopedic Surgery

## 2017-11-06 ENCOUNTER — Encounter (HOSPITAL_COMMUNITY)
Admission: EM | Disposition: A | Discharge status: Home / Self Care | Payer: Self-pay | Source: Home / Self Care | Attending: Internal Medicine

## 2017-11-06 DIAGNOSIS — R109 Unspecified abdominal pain: Secondary | ICD-10-CM

## 2017-11-06 HISTORY — PX: CHOLECYSTECTOMY: SHX55

## 2017-11-06 LAB — URINALYSIS, ROUTINE W REFLEX MICROSCOPIC
BILIRUBIN URINE: NEGATIVE
Bacteria, UA: NONE SEEN
GLUCOSE, UA: NEGATIVE mg/dL
Ketones, ur: NEGATIVE mg/dL
Leukocytes, UA: NEGATIVE
NITRITE: NEGATIVE
PH: 8 (ref 5.0–8.0)
Protein, ur: NEGATIVE mg/dL
SPECIFIC GRAVITY, URINE: 1.005 (ref 1.005–1.030)

## 2017-11-06 LAB — COMPREHENSIVE METABOLIC PANEL
ALT: 412 U/L — ABNORMAL HIGH (ref 14–54)
AST: 181 U/L — AB (ref 15–41)
Albumin: 3.6 g/dL (ref 3.5–5.0)
Alkaline Phosphatase: 119 U/L (ref 38–126)
Anion gap: 7 (ref 5–15)
BUN: <5 mg/dL — ABNORMAL LOW (ref 6–20)
CHLORIDE: 103 mmol/L (ref 101–111)
CO2: 26 mmol/L (ref 22–32)
Calcium: 9 mg/dL (ref 8.9–10.3)
Creatinine, Ser: 0.66 mg/dL (ref 0.44–1.00)
Glucose, Bld: 93 mg/dL (ref 65–99)
POTASSIUM: 3.7 mmol/L (ref 3.5–5.1)
Sodium: 136 mmol/L (ref 135–145)
Total Bilirubin: 0.6 mg/dL (ref 0.3–1.2)
Total Protein: 6.8 g/dL (ref 6.5–8.1)

## 2017-11-06 LAB — CBC WITH DIFFERENTIAL/PLATELET
BASOS ABS: 0.1 10*3/uL (ref 0.0–0.1)
Basophils Relative: 1 %
EOS ABS: 0.2 10*3/uL (ref 0.0–0.7)
EOS PCT: 5 %
HCT: 38.4 % (ref 36.0–46.0)
Hemoglobin: 12.7 g/dL (ref 12.0–15.0)
LYMPHS PCT: 43 %
Lymphs Abs: 2.1 10*3/uL (ref 0.7–4.0)
MCH: 27.9 pg (ref 26.0–34.0)
MCHC: 33.1 g/dL (ref 30.0–36.0)
MCV: 84.2 fL (ref 78.0–100.0)
MONO ABS: 0.4 10*3/uL (ref 0.1–1.0)
Monocytes Relative: 8 %
Neutro Abs: 2.1 10*3/uL (ref 1.7–7.7)
Neutrophils Relative %: 43 %
PLATELETS: 186 10*3/uL (ref 150–400)
RBC: 4.56 MIL/uL (ref 3.87–5.11)
RDW: 15 % (ref 11.5–15.5)
WBC: 4.8 10*3/uL (ref 4.0–10.5)

## 2017-11-06 LAB — PHOSPHORUS: PHOSPHORUS: 4.9 mg/dL — AB (ref 2.5–4.6)

## 2017-11-06 LAB — MAGNESIUM: MAGNESIUM: 2.2 mg/dL (ref 1.7–2.4)

## 2017-11-06 LAB — LIPASE, BLOOD: LIPASE: 53 U/L — AB (ref 11–51)

## 2017-11-06 SURGERY — LAPAROSCOPIC CHOLECYSTECTOMY
Anesthesia: General | Site: Abdomen

## 2017-11-06 MED ORDER — MORPHINE SULFATE (PF) 4 MG/ML IV SOLN
4.0000 mg | INTRAVENOUS | Status: AC
  Administered 2017-11-07: 4 mg via INTRAVENOUS
  Filled 2017-11-06: qty 1

## 2017-11-06 MED ORDER — MEPERIDINE HCL 25 MG/ML IJ SOLN: 6.2500 mg | INTRAMUSCULAR | Status: DC | PRN

## 2017-11-06 MED ORDER — IBUPROFEN 600 MG PO TABS: 600.0000 mg | ORAL_TABLET | Freq: Four times a day (QID) | ORAL | Status: DC | PRN

## 2017-11-06 MED ORDER — PROPOFOL 10 MG/ML IV BOLUS
INTRAVENOUS | Status: DC | PRN
  Administered 2017-11-06: 150 mg via INTRAVENOUS

## 2017-11-06 MED ORDER — LIDOCAINE 2% (20 MG/ML) 5 ML SYRINGE
INTRAMUSCULAR | Status: AC
  Filled 2017-11-06: qty 5

## 2017-11-06 MED ORDER — SIMETHICONE 80 MG PO CHEW
40.0000 mg | CHEWABLE_TABLET | Freq: Four times a day (QID) | ORAL | Status: DC | PRN
  Administered 2017-11-06 – 2017-11-10 (×4): 40 mg via ORAL
  Filled 2017-11-06 (×4): qty 1

## 2017-11-06 MED ORDER — TRAMADOL HCL 50 MG PO TABS
100.0000 mg | ORAL_TABLET | Freq: Four times a day (QID) | ORAL | Status: DC | PRN
  Administered 2017-11-07 – 2017-11-10 (×13): 100 mg via ORAL
  Filled 2017-11-06 (×13): qty 2

## 2017-11-06 MED ORDER — KCL-LACTATED RINGERS-D5W 20 MEQ/L IV SOLN
INTRAVENOUS | Status: DC
  Administered 2017-11-06 – 2017-11-07 (×2): via INTRAVENOUS
  Filled 2017-11-06 (×3): qty 1000

## 2017-11-06 MED ORDER — PHENYLEPHRINE 40 MCG/ML (10ML) SYRINGE FOR IV PUSH (FOR BLOOD PRESSURE SUPPORT)
PREFILLED_SYRINGE | INTRAVENOUS | Status: AC
  Filled 2017-11-06: qty 10

## 2017-11-06 MED ORDER — MORPHINE SULFATE (PF) 4 MG/ML IV SOLN
2.0000 mg | INTRAVENOUS | Status: DC | PRN
  Administered 2017-11-07 (×3): 2 mg via INTRAVENOUS
  Filled 2017-11-06 (×3): qty 1

## 2017-11-06 MED ORDER — HYDROMORPHONE HCL 1 MG/ML IJ SOLN
INTRAMUSCULAR | Status: AC
  Filled 2017-11-06: qty 1

## 2017-11-06 MED ORDER — DEXAMETHASONE SODIUM PHOSPHATE 10 MG/ML IJ SOLN
INTRAMUSCULAR | Status: DC | PRN
  Administered 2017-11-06: 10 mg via INTRAVENOUS

## 2017-11-06 MED ORDER — LACTATED RINGERS IV SOLN
INTRAVENOUS | Status: DC
  Administered 2017-11-06 – 2017-11-07 (×4): via INTRAVENOUS

## 2017-11-06 MED ORDER — HYDROMORPHONE HCL 1 MG/ML IJ SOLN
0.2500 mg | INTRAMUSCULAR | Status: DC | PRN
  Administered 2017-11-06 (×4): 0.5 mg via INTRAVENOUS

## 2017-11-06 MED ORDER — BUPIVACAINE-EPINEPHRINE (PF) 0.25% -1:200000 IJ SOLN
INTRAMUSCULAR | Status: DC | PRN
  Administered 2017-11-06: 30 mL

## 2017-11-06 MED ORDER — SUGAMMADEX SODIUM 200 MG/2ML IV SOLN
INTRAVENOUS | Status: DC | PRN
  Administered 2017-11-06: 150 mg via INTRAVENOUS

## 2017-11-06 MED ORDER — DOCUSATE SODIUM 100 MG PO CAPS
100.0000 mg | ORAL_CAPSULE | Freq: Two times a day (BID) | ORAL | Status: DC
  Administered 2017-11-06 – 2017-11-09 (×6): 100 mg via ORAL
  Filled 2017-11-06 (×6): qty 1

## 2017-11-06 MED ORDER — EPHEDRINE SULFATE 50 MG/ML IJ SOLN
INTRAMUSCULAR | Status: AC
  Filled 2017-11-06: qty 1

## 2017-11-06 MED ORDER — FENTANYL CITRATE (PF) 100 MCG/2ML IJ SOLN
INTRAMUSCULAR | Status: DC | PRN
  Administered 2017-11-06: 150 ug via INTRAVENOUS
  Administered 2017-11-06 (×4): 50 ug via INTRAVENOUS

## 2017-11-06 MED ORDER — ONDANSETRON HCL 4 MG/2ML IJ SOLN
INTRAMUSCULAR | Status: AC
  Filled 2017-11-06: qty 2

## 2017-11-06 MED ORDER — BUPIVACAINE-EPINEPHRINE (PF) 0.25% -1:200000 IJ SOLN
INTRAMUSCULAR | Status: AC
  Filled 2017-11-06: qty 30

## 2017-11-06 MED ORDER — PROPOFOL 10 MG/ML IV BOLUS
INTRAVENOUS | Status: AC
  Filled 2017-11-06: qty 20

## 2017-11-06 MED ORDER — ONDANSETRON HCL 4 MG/2ML IJ SOLN: 4.0000 mg | Freq: Once | INTRAMUSCULAR | Status: DC | PRN

## 2017-11-06 MED ORDER — SUGAMMADEX SODIUM 200 MG/2ML IV SOLN
INTRAVENOUS | Status: AC
  Filled 2017-11-06: qty 2

## 2017-11-06 MED ORDER — MIDAZOLAM HCL 2 MG/2ML IJ SOLN
INTRAMUSCULAR | Status: AC
  Filled 2017-11-06: qty 2

## 2017-11-06 MED ORDER — FENTANYL CITRATE (PF) 250 MCG/5ML IJ SOLN
INTRAMUSCULAR | Status: AC
  Filled 2017-11-06: qty 5

## 2017-11-06 MED ORDER — DEXAMETHASONE SODIUM PHOSPHATE 10 MG/ML IJ SOLN
INTRAMUSCULAR | Status: AC
  Filled 2017-11-06: qty 1

## 2017-11-06 MED ORDER — ONDANSETRON HCL 4 MG/2ML IJ SOLN
INTRAMUSCULAR | Status: DC | PRN
  Administered 2017-11-06: 4 mg via INTRAVENOUS

## 2017-11-06 MED ORDER — LIDOCAINE 2% (20 MG/ML) 5 ML SYRINGE
INTRAMUSCULAR | Status: DC | PRN
  Administered 2017-11-06: 100 mg via INTRAVENOUS

## 2017-11-06 MED ORDER — ROCURONIUM BROMIDE 10 MG/ML (PF) SYRINGE
PREFILLED_SYRINGE | INTRAVENOUS | Status: AC
  Filled 2017-11-06: qty 5

## 2017-11-06 MED ORDER — 0.9 % SODIUM CHLORIDE (POUR BTL) OPTIME
TOPICAL | Status: DC | PRN
  Administered 2017-11-06: 1000 mL

## 2017-11-06 MED ORDER — ONDANSETRON HCL 4 MG/2ML IJ SOLN
4.0000 mg | Freq: Four times a day (QID) | INTRAMUSCULAR | Status: DC | PRN
  Administered 2017-11-06 – 2017-11-09 (×7): 4 mg via INTRAVENOUS
  Filled 2017-11-06 (×4): qty 2

## 2017-11-06 MED ORDER — ROCURONIUM BROMIDE 10 MG/ML (PF) SYRINGE
PREFILLED_SYRINGE | INTRAVENOUS | Status: DC | PRN
  Administered 2017-11-06: 50 mg via INTRAVENOUS

## 2017-11-06 MED ORDER — ONDANSETRON 4 MG PO TBDP
4.0000 mg | ORAL_TABLET | Freq: Four times a day (QID) | ORAL | Status: DC | PRN
Start: 2017-11-06 — End: 2017-11-11

## 2017-11-06 MED ORDER — SODIUM CHLORIDE 0.9 % IR SOLN
Status: DC | PRN
  Administered 2017-11-06: 1000 mL

## 2017-11-06 MED ORDER — MIDAZOLAM HCL 5 MG/5ML IJ SOLN
INTRAMUSCULAR | Status: DC | PRN
  Administered 2017-11-06: 2 mg via INTRAVENOUS

## 2017-11-06 SURGICAL SUPPLY — 34 items
APPLIER CLIP 5 13 M/L LIGAMAX5 (MISCELLANEOUS) ×2
BLADE CLIPPER SURG (BLADE) IMPLANT
CANISTER SUCT 3000ML PPV (MISCELLANEOUS) ×2 IMPLANT
CHLORAPREP W/TINT 26ML (MISCELLANEOUS) ×2 IMPLANT
CLIP APPLIE 5 13 M/L LIGAMAX5 (MISCELLANEOUS) ×1 IMPLANT
COVER SURGICAL LIGHT HANDLE (MISCELLANEOUS) ×2 IMPLANT
DERMABOND ADVANCED (GAUZE/BANDAGES/DRESSINGS) ×1
DERMABOND ADVANCED .7 DNX12 (GAUZE/BANDAGES/DRESSINGS) ×1 IMPLANT
DISSECTOR BLUNT TIP ENDO 5MM (MISCELLANEOUS) ×2 IMPLANT
ELECT REM PT RETURN 9FT ADLT (ELECTROSURGICAL) ×2
ELECTRODE REM PT RTRN 9FT ADLT (ELECTROSURGICAL) ×1 IMPLANT
GLOVE BIOGEL M STRL SZ7.5 (GLOVE) ×2 IMPLANT
GLOVE BIOGEL PI IND STRL 8 (GLOVE) ×1 IMPLANT
GLOVE BIOGEL PI INDICATOR 8 (GLOVE) ×1
GOWN STRL REUS W/ TWL LRG LVL3 (GOWN DISPOSABLE) ×2 IMPLANT
GOWN STRL REUS W/ TWL XL LVL3 (GOWN DISPOSABLE) ×1 IMPLANT
GOWN STRL REUS W/TWL LRG LVL3 (GOWN DISPOSABLE) ×2
GOWN STRL REUS W/TWL XL LVL3 (GOWN DISPOSABLE) ×1
KIT BASIN OR (CUSTOM PROCEDURE TRAY) ×2 IMPLANT
KIT ROOM TURNOVER OR (KITS) ×2 IMPLANT
NS IRRIG 1000ML POUR BTL (IV SOLUTION) ×2 IMPLANT
PAD ARMBOARD 7.5X6 YLW CONV (MISCELLANEOUS) ×2 IMPLANT
POUCH SPECIMEN RETRIEVAL 10MM (ENDOMECHANICALS) ×2 IMPLANT
SCISSORS LAP 5X35 DISP (ENDOMECHANICALS) ×2 IMPLANT
SET IRRIG TUBING LAPAROSCOPIC (IRRIGATION / IRRIGATOR) ×2 IMPLANT
SLEEVE ENDOPATH XCEL 5M (ENDOMECHANICALS) ×4 IMPLANT
SPECIMEN JAR SMALL (MISCELLANEOUS) ×2 IMPLANT
SUT MNCRL AB 4-0 PS2 18 (SUTURE) ×2 IMPLANT
TOWEL OR 17X24 6PK STRL BLUE (TOWEL DISPOSABLE) ×2 IMPLANT
TOWEL OR 17X26 10 PK STRL BLUE (TOWEL DISPOSABLE) ×2 IMPLANT
TRAY LAPAROSCOPIC MC (CUSTOM PROCEDURE TRAY) ×2 IMPLANT
TROCAR XCEL BLUNT TIP 100MML (ENDOMECHANICALS) ×2 IMPLANT
TROCAR XCEL NON-BLD 5MMX100MML (ENDOMECHANICALS) ×2 IMPLANT
TUBING INSUFFLATION (TUBING) ×2 IMPLANT

## 2017-11-06 NOTE — Op Note (Signed)
11/02/2017 - 11/06/2017 2:28 PM  PATIENT: Yesenia Bennett  31 y.o. female  Patient Care Team: System, Pcp Not In as PCP - General  PRE-OPERATIVE DIAGNOSIS: cholecystitis  POST-OPERATIVE DIAGNOSIS: cholecystitis  PROCEDURE: Laparoscopic cholecystectomy  SURGEON: Yesenia Mt. Christel Bai, MD  ASSISTANT: none   ANESTHESIA: General endotracheal  EBL: Total I/O In: 1000 [I.V.:1000] Out: -   DRAINS: None  SPECIMEN: Gallbladder  COUNTS: Sponge, needle and instrument counts were reported correct x2 at the conclusion of the operation  DISPOSITION: PACU in satisfactory condition  FINDINGS: Mildly thickened peritoneum lining gallbladder, mildly edematous. Critical view of safety obtained prior to ligating or dividing any structures. ~5cc of bile spillage during instrumentation of gallbladder into endocatch sac.  INDICATION: Yesenia Bennett is a 31yo female with history of hepatitis C, and recent history of right upper quadrant abdominal pain times 2 weeks.  She had multiple bouts of nausea and vomiting.  She was seen in the emergency room on the morning of 11/04/17 for these problems.  She was seen by my partner and exhibited right upper quadrant tenderness on exam.  She was afebrile.  Her Yesenia Bennett blood cell count was 4.  Her LFTs were remarkable for a transaminitis however her bilirubin was normal.  This was trended over the following 2 days and did not change.  Her lipase is mildly elevated at 53 on the day of surgery which was down from 88 four days prior.  Her right upper quadrant ultrasound demonstrated a mildly thickened gallbladder wall but no stones.  HIDA scan was performed and demonstrated no visible gallbladder activity after 100 minutes, however, after administration of IV morphine the gallbladder was visualized at 120 minutes which was felt to be consistent with chronic cholecystitis.  The anatomy & physiology of hepatobiliary & pancreatic function was discussed. The pathophysiology of  gallbladder dysfunction was discussed. Natural history and material risks without surgery was discussed. I feel the risks of no intervention will lead to serious problems that outweigh the operative risks; therefore, I recommended cholecystectomy to remove the probable pathology. I explained laparoscopic techniques with possible need for an open approach. Additionally, I discussed the possible need for a cholangiogram to evaluate the bilary tract, if indicated.  The procedure, material risks (including but not limited to pain, bleeding; infection; scarring; abscess; bile leak; injury to other organs such as bowel, blood vessels, nerves, bile duct; hernia; the need for additional procedures; heart attack; stroke; death), benefits and alternatives to surgery were discussed at length. The patient's questions were answered to their satisfaction and the patient elected to proceed with surgery. Possibility that this will not correct all abdominal symptoms was also explained.  Goals of post-operative recovery were discussed as well.     DESCRIPTION:  The patient was identified & brought into the operating room. The patient was positioned supine on the OR table. SCDs were in place and active during the entire case. The patient underwent general endotracheal anesthesia. The abdomen was prepped and draped in the standard sterile fashion and antibiotics were administered. A surgical timeout was performed and confirmed our plan.   Local anesthetic was infiltrated into the planned incisions. A periumbilical incision was made. The umbilical stalk was grasped and retracted outwardly. The infraumbilical fascia was identified and incised. The peritoneal cavity was gently entered bluntly. A purse-string 0 Vicryl suture was placed. The Hasson cannula was inserted into the peritoneal cavity and insufflation with CO2 commenced to 57mmHg. A laparoscope was inserted into the peritoneal cavity and inspection confirmed  no evidence of  trocar site complications. The patient was then positioned in reverse Trendelenburg with the left side down. 3 additional 39mm trocars were placed along the right subcostal line - one 19mm port in mid subcostal region, another 44mm port in the right flank near the anterior axillary line, and a third 38mm port in the left subxiphoid region obliquely near the falciform ligament.  The liver and gallbladder were inspected. The gallbladder fundus was grasped and elevated cephalad. An additional grasper was then placed on the infundibulum of the gallbladder and the infundibulum was retracted laterally. Gentle blunt dissection was then employed with a IT consultant working down into [employer]. The peritoneum on both sides of the gallbladder was opened with hook cautery. The cystic duct was identified, carefully circumferentially dissected. The cystic artery was then carefully circumferentially dissected. The space between the cystic artery and hepatocystic plate was developed such that the liver could be seen through a window medial to the cystic artery. The triangle of Calot was cleared of all fibrofatty tissue. At this point, a critical view of safety was achieved and the only structures visualized was the skeletonized cystic duct laterally, the skeletonized cystic artery and the liver through the window medial to the artery.   The cystic duct and artery were clipped with 2 clips on the "stay" side and 1 clip on the specimen side. The cystic duct and artery were then divided. The gallbladder was then freed from its remaining attachments to the liver using electrocautery and placed into an endocatch bag. Hemostasis was achieved and then re-verified. The rest of the abdomen was inspected no injury nor bleeding elsewhere was identified. The RUQ was irrigated with normal saline until the effluent ran clear. The clips and gallbladder fossa were reinspected and noted to be in place and hemostatic.  The  gallbladder and endocatch bag was then removed from the umbilical port site and passed off as specimen. The RUQ ports were removed under direct visualization and noted to be hemostatic. The umbilical fascia was then closed using 0 Vicryl suture. The skin of all incision sites was approximated with 4-0 monocryl subcuticular suture and dermabond applied. The patient was then extubated and transferred to a stretcher for transport to PACU in satisfactory condition.

## 2017-11-06 NOTE — Anesthesia Procedure Notes (Signed)
Procedure Name: Intubation Date/Time: 11/06/2017 1:29 PM Performed by: Harden Mo, CRNA Pre-anesthesia Checklist: Patient identified, Emergency Drugs available, Suction available and Patient being monitored Patient Re-evaluated:Patient Re-evaluated prior to induction Oxygen Delivery Method: Circle System Utilized Preoxygenation: Pre-oxygenation with 100% oxygen Induction Type: IV induction Ventilation: Mask ventilation without difficulty Laryngoscope Size: Miller and 2 Grade View: Grade I Tube type: Oral Tube size: 7.0 mm Number of attempts: 1 Airway Equipment and Method: Stylet and Oral airway Placement Confirmation: ETT inserted through vocal cords under direct vision,  positive ETCO2 and breath sounds checked- equal and bilateral Secured at: 21 cm Tube secured with: Tape Dental Injury: Teeth and Oropharynx as per pre-operative assessment

## 2017-11-06 NOTE — Discharge Instructions (Signed)
[facility] ______[facility] °LAPAROSCOPIC SURGERY: POST OP INSTRUCTIONS °Always review your discharge instruction sheet given to you by the facility where your surgery was performed. °IF YOU HAVE DISABILITY OR FAMILY LEAVE FORMS, YOU MUST BRING THEM TO THE OFFICE FOR PROCESSING.   °DO NOT GIVE THEM TO YOUR DOCTOR. ° °1. A prescription for pain medication may be given to you upon discharge.  Take your pain medication as prescribed, if needed.  If narcotic pain medicine is not needed, then you may take acetaminophen (Tylenol) or ibuprofen (Advil) as needed. °2. Take your usually prescribed medications unless otherwise directed. °3. If you need a refill on your pain medication, please contact your pharmacy.  They will contact our office to request authorization. Prescriptions will not be filled after 5pm or on week-ends. °4. You should follow a light diet the first few days after arrival home, such as soup and crackers, etc.  Be sure to include lots of fluids daily. °5. Most patients will experience some swelling and bruising in the area of the incisions.  Ice packs will help.  Swelling and bruising can take several days to resolve.  °6. It is common to experience some constipation if taking pain medication after surgery.  Increasing fluid intake and taking a stool softener (such as Colace) will usually help or prevent this problem from occurring.  A mild laxative (Milk of Magnesia or Miralax) should be taken according to package instructions if there are no bowel movements after 48 hours. °7. Unless discharge instructions indicate otherwise, you may remove your bandages 24-48 hours after surgery, and you may shower at that time.  You may have steri-strips (small skin tapes) in place directly over the incision.  These strips should be left on the skin for 7-10 days.  If your surgeon used skin glue on the incision, you may shower in 24 hours.  The glue will flake off over the next 2-3 weeks.  Any sutures or  staples will be removed at the office during your follow-up visit. °8. ACTIVITIES:  You may resume regular (light) daily activities beginning the next day--such as daily self-care, walking, climbing stairs--gradually increasing activities as tolerated.  You may have sexual intercourse when it is comfortable.  Refrain from any heavy lifting or straining until approved by your doctor. °a. You may drive when you are no longer taking prescription pain medication, you can comfortably wear a seatbelt, and you can safely maneuver your car and apply brakes. °b. RETURN TO WORK:  __________________________________________________________ °9. You should see your doctor in the office for a follow-up appointment approximately 2-3 weeks after your surgery.  Make sure that you call for this appointment within a day or two after you arrive home to insure a convenient appointment time. °10. OTHER INSTRUCTIONS: __________________________________________________________________________________________________________________________ __________________________________________________________________________________________________________________________ °WHEN TO CALL YOUR DOCTOR: °1. Fever over 101.0 °2. Inability to urinate °3. Continued bleeding from incision. °4. Increased pain, redness, or drainage from the incision. °5. Increasing abdominal pain ° °The clinic staff is available to answer your questions during regular business hours.  Please don’t hesitate to call and ask to speak to one of the nurses for clinical concerns.  If you have a medical emergency, go to the nearest emergency room or call 911.  A surgeon from [facility] is always on call at the hospital. °[address], [address], [place], [place]  [address] ? [address] [address], [place], [place]   [address] °[phone] ? [phone] ? FAX [phone] °Web site:   [email or web address] °

## 2017-11-06 NOTE — Anesthesia Postprocedure Evaluation (Signed)
Anesthesia Post Note  Patient: Yesenia Bennett  Procedure(s) Performed: LAPAROSCOPIC CHOLECYSTECTOMY (N/A Abdomen)     Patient location during evaluation: PACU Anesthesia Type: General Level of consciousness: awake and alert Pain management: pain level controlled Vital Signs Assessment: post-procedure vital signs reviewed and stable Respiratory status: spontaneous breathing, nonlabored ventilation, respiratory function stable and patient connected to nasal cannula oxygen Cardiovascular status: blood pressure returned to baseline and stable Postop Assessment: no apparent nausea or vomiting Anesthetic complications: no    Last Vitals:  Vitals:   11/06/17 1520 11/06/17 1530  BP: 125/86 137/74  Pulse:  62  Resp:  18  Temp:  36.9 C  SpO2:  100%    Last Pain:  Vitals:   11/06/17 1530  TempSrc: Oral  PainSc:                  Shantea Poulton DAVID

## 2017-11-06 NOTE — Transfer of Care (Signed)
Immediate Anesthesia Transfer of Care Note  Patient: Yesenia Bennett  Procedure(s) Performed: LAPAROSCOPIC CHOLECYSTECTOMY (N/A Abdomen)  Patient Location: PACU  Anesthesia Type:General  Level of Consciousness: awake, alert  and oriented  Airway & Oxygen Therapy: Patient Spontanous Breathing  Post-op Assessment: Report given to RN, Post -op Vital signs reviewed and stable and Patient moving all extremities X 4  Post vital signs: Reviewed and stable  Last Vitals:  Vitals:   11/05/17 2121 11/06/17 0503  BP: 114/64 116/80  Pulse: (!) 50 (!) 52  Resp: 18 16  Temp: 36.7 C 36.7 C  SpO2: 100% 100%    Last Pain:  Vitals:   11/06/17 1130  TempSrc:   PainSc: 3       Patients Stated Pain Goal: 2 (96/88/64 8472)  Complications: No apparent anesthesia complications

## 2017-11-06 NOTE — Progress Notes (Signed)
    CC: Right upper quadrant pain.  Subjective: Complains of ongoing pain right upper quadrant to back.  Objective: Vital signs in last 24 hours: Temp:  [98.1 F (36.7 C)-98.7 F (37.1 C)] 98.1 F (36.7 C) (12/27 0503) Pulse Rate:  [50-56] 52 (12/27 0503) Resp:  [16-18] 16 (12/27 0503) BP: (114-122)/(64-80) 116/80 (12/27 0503) SpO2:  [99 %-100 %] 100 % (12/27 0503) Last BM Date: 11/05/17 700 PO yesterday 50 IV Urine x 1 recorded Other 600 out Afebrile, VSS Lipase 53 AST 181, ALT 412 WBC is normal  Intake/Output from previous day: 12/26 0701 - 12/27 0700 In: 750 [P.O.:700; IV Piggyback:50] Out: 600  Intake/Output this shift: No intake/output data recorded.  General appearance: alert, cooperative and mild distress GI: Soft, tender, right upper quadrant heating pack in place.  Lab Results:  Recent Labs    11/05/17 0527 11/06/17 0605  WBC 4.6 4.8  HGB 12.1 12.7  HCT 36.1 38.4  PLT 166 186    BMET Recent Labs    11/05/17 0527 11/06/17 0605  NA 137 136  K 3.7 3.7  CL 106 103  CO2 22 26  GLUCOSE 96 93  BUN 5* <5*  CREATININE 0.59 0.66  CALCIUM 8.4* 9.0   PT/INR No results for input(s): LABPROT, INR in the last 72 hours.  Recent Labs  Lab 11/02/17 2021 11/03/17 0709 11/04/17 0758 11/05/17 0527 11/06/17 0605  AST 166* 129* 203* 186* 181*  ALT 424* 337* 407* 404* 412*  ALKPHOS 123 91 111 110 119  BILITOT 0.7 0.8 0.8 0.7 0.6  PROT 7.1 6.0* 6.6 6.6 6.8  ALBUMIN 3.7 3.3* 3.5 3.3* 3.6     Lipase     Component Value Date/Time   LIPASE 53 (H) 11/06/2017 6924     Medications: . enoxaparin (LOVENOX) injection  40 mg Subcutaneous Daily  . nicotine  21 mg Transdermal Daily   . cefTRIAXone (ROCEPHIN)  IV Stopped (11/05/17 1008)   Assessment/Plan RUQ abdominal pain with elevated lipase and elevated LFTs - lipase 88 on 12/23 - AST/ALT 186/404, Tbili 0.7, Alk Phos 110 Chronic cholecystitis - HIDA gallbladder did not fill until morphine  administration concerning for chronic cholecystitis  Hepatitis C Subclinical hypothyroidism PSA including opioids Mood disorder hx Normocytic anemia Hx of cervical cancer Hx of herpes simplex Urinary urgency  FEN: NPO after midnight VTE: SCDs, hold lovenox tomorrow ID: no current abx  Plan: OR later today.  Restarting IV fluids lactated Ringer's +20 KCl at 125 ml/hr   LOS: 2 days    Yesenia Bennett 11/06/2017 [phone]

## 2017-11-06 NOTE — Anesthesia Preprocedure Evaluation (Addendum)
Anesthesia Evaluation  Patient identified by MRN, date of birth, ID band Patient awake    Reviewed: Allergy & Precautions, NPO status , Patient's Chart, lab work & pertinent test results  Airway Mallampati: I  TM Distance: >3 FB Neck ROM: Full    Dental  (+) Poor Dentition, Missing, Chipped, Dental Advisory Given,    Pulmonary Current Smoker,    Pulmonary exam normal        Cardiovascular Normal cardiovascular exam     Neuro/Psych Anxiety Depression Bipolar Disorder    GI/Hepatic GERD  Controlled,(+)     substance abuse  IV drug use, Hepatitis -, C  Endo/Other    Renal/GU      Musculoskeletal   Abdominal   Peds  Hematology   Anesthesia Other Findings   Reproductive/Obstetrics                            Anesthesia Physical Anesthesia Plan  ASA: II  Anesthesia Plan: General   Post-op Pain Management:    Induction: Intravenous  PONV Risk Score and Plan: 2 and Ondansetron, Midazolam and Treatment may vary due to age or medical condition  Airway Management Planned: Oral ETT  Additional Equipment:   Intra-op Plan:   Post-operative Plan: Extubation in OR  Informed Consent: I have reviewed the patients History and Physical, chart, labs and discussed the procedure including the risks, benefits and alternatives for the proposed anesthesia with the patient or authorized representative who has indicated his/her understanding and acceptance.     Plan Discussed with: CRNA and Surgeon  Anesthesia Plan Comments:         Anesthesia Quick Evaluation

## 2017-11-06 NOTE — Progress Notes (Signed)
PROGRESS NOTE    Yesenia Bennett  DGU:[id number] DOB: April 16, 1986 DOA: 11/02/2017 PCP: System, Pcp Not In   Brief Narrative:  HPI Per Dr. Elesa Hacker is a 31 y.o. female with medical history significant of Heroin Abuse, hepatitis C, anxiety, depression, PTSD, and cervical cancer diagnosed 2010; resents with complaints of right upper quadrant pain for the last 2 weeks. Describes the pain as a stabbing and cramping pain mostly in the right upper quadrant that radiates to her shoulder. Eating any food seemed to worsen symptoms.  Associated symptoms include complaints of chills, nausea, nonbloody emesis, poor p.o. intake, heartburn, and dizziness.  Patient was previously seen at [facility] on 12/13 with complaints of nausea and vomiting.  Review of records shows that patient was evaluated and seen to have elevated ALT  267, AST 139, and lipase 41.  It appeared she underwent CT scan which showed no acute abnormalities and abdominal ultrasound which showed a small amount of sludge within the gallbladder and mild splenomegaly.  Notes that she has had she just recently completed Heroin rehab at [facility], and was the reason why she did not stay at when previously evaluated.  Currently, not on any treatment for her previous history of mood disorders.  Upon admission into the emergency room patient was seen to be afebrile with vital signs relatively within normal limits.  Labs revealed AST 166, ALT 424, lipase 88, total bilirubin 0.7, and all other labs relatively within normal limits.  Chest x-ray was unremarkable on right upper quadrant ultrasound showed signs of mild gallbladder wall thickening without signs of gallstones.  She received 2 L of normal saline IV fluids.  General surgery was consulted, but recommended GI evaluation first as does not appear patient needs a emergent cholecystectomy at this time.  [facility] physician on call was consulted and recommended adding on ethanol level, acetaminophen,  hepatitis panel, and INR.  TRH called to admit.  Patient currently still having RUQ Pain. Gastroenterology evaluated and recommend against MRCP and recommending HIDA scan and Surgical Consultation. Surgery evaluated and are determining timing of Cholecystectomy and patient to undergo Lap Chole vs. Open Cholecystectomy this Afternoon.   Assessment & Plan:   Principal Problem:   RUQ pain Active Problems:   Elevated LFTs   Elevated lipase   Urinary urgency   Chronic hepatitis C without hepatic coma (HCC)   Subclinical hypothyroidism   Mood disorder in conditions classified elsewhere   Polysubstance abuse (HCC)   Chronic cholecystitis  Right Upper Quadrant Abdominal Pain associated with evated Lipase, elevated LFTs in the setting of Chronic Cholecystitis  -Patient presented with complaints of worsening right upper quadrant abdominal pain with radiation to her shoulder.   -Lipase was 88 on Admission, AST was 166, ALT was 424 -Repeat Lipase was 53; AST and ALT remain elevated  -Ultrasound showed mild signs of gallbladder wall thickening suspected secondary to contraction and no gallstones. Due to the elevation in liver enzymes and lipase question the possibility of a large gallstone.  -Gastroenterology consulted and evaluated and feel like cause of pain is difficult to determine and feel there is no convincing evidence of Acute Cholecystitis and do not feel these are typical for Peptic Ulcer so are not pursuing further endoscopic procedures.  -Admitted to a MedSurg bed -Continue to Monitor intake and output -IVF Stopped yesterday but restarted at LR + 20 KCl at 125 mLhr  -Morphine IV prn pain had been D/C'd and started IV Ketorolac (now expired) but IV Morphine  restarted by General Surgery Team  -Acetaminophen Level was <10, PT-INR was 16.6-1.35, and EtOH level was <10  -Gastroenterology recommending against MRCP and recommend obtaining a HIDA Scan -HIDA showed No evidence of cystic duct  obstruction to confirm acute cholecystitis. The gallbladder is visible after IV morphine administration. Nonvisualization of the gallbladder out to 1 hour 20 minutes prior to IV morphine administration most likely indicates chronic cholecystitis.  Mild tracer retention by the liver indicating mild  Hepatocellular disease, which is compatible with the given history of hepatitis-C and elevated liver function tests -General Surgery evaluated and are determining the timing of the Cholecystectomy; Patient to undergo Lap Cholecystectomy today  -Hx of being on Suboxone 8-2 mg Film SL BID and has been a Heroin Addict  -General Surgery started patient on IV Morphine, Tramadol 50 mg po q6hprn for moderate pain, and also started IV Abx with Ceftriaxone 2 grams q24h -Diet NPO for Surgery today  -Repeat CMP in AM   Urinary Urgency and now Flank Pain -Acute.  Patient reports having urinary urgency and pressure symptoms. -Initial Urinalysis and showed Hazy Appearance, Amber Color, Large Hb, 6-30 RBC, 0-5 WBC and Negative Leukocytes and Nitrites -Repeat Urinalysis and Urine Cx -IVF restarted by General Surgery at [facility] + 20 KCl @ 125 mL/hr -Continue to Monitor and may need to evaluate for Nephrolithiasis with CT Renal Protocol   Hepatitis C -Records show diagnosed with hepatitis C Genotype  1a  back in 2015-2016, and has not had treatment for this at this time.  -AST went from 166 -> 129 -> 203 -> 186 -> 181 -ALT went from 424 -> 337 -> 407 -> 404 -> 412 -Follow-up Repeat Hepatitis Panel shows Hep C Ab >11.0 -Per Gastroenterology not a Candidate for Treatment currently but will need to be referred to Infectious Disease Clinic after D/C   History of Subclinical Hypothyroidism -Previously documented back in 2016. -Checked TSH and was 0.404  History of Polysubstance Abuse including Opioid Abuse  -Previous history of Heroin Abuse -Avoid Narcotics and D/C'd IV Morphine and started IV Ketorolac for Patient  however General Surgery started patient  Back on IV Morphine 2 mg IV q2hprn but will change to IV q6hprn; Will need to obtain records from [facility] if patient still sees them -General Surgery also started Tramadol 50 mg po q6hprn -Still current uses  tobacco, and unclear about other drugs. -C/w Nicotine Patch 21 mg TD q24h for Tobacco Abuse  Mood Disorder History -Patient with previous history of PTSD, Anxiety, Depression, other diagnoses -Currently not on any treatment for these at this time but review shows she has been on Citalopram 40 mg po Daily and Oxcarbazepine 300 mg po BID  Normocytic Anemia -Likely dilutional Drop though Urinalysis did show some blood -Hb/Hct stable at 12.7/38.4 -Continue to Monitor for S/Sx of Bleeding as patient has Hx of Cervical Cancer and Hematuria with Aspirin, and Ibuprofen  -Repeat CBC in AM   Hx of Cervical Cancer -Follow up with Oncology as an outpatient  Hx of Herpes Simplex -Review of Records from [place] show patient has been on Valacyclovir in the past  Hyperphosphatemia -Patient;s Phos Level was 4.9 -Continue to Monitor and Repeat Phos Level in AM  DVT prophylaxis: Enoxaparin 40 mg sq q24h Code Status: FULL CODE Family Communication: Discussed with boyfriend at bedside Disposition Plan: Patient to undergo Laparoscopic vs. Open Cholecystectomy today   Consultants:   Gastroenterology  General Surgery    Procedures:  HIDA Scan   Lap  Chole vs. Open Cholecystectomy today    Antimicrobials:  Anti-infectives (From admission, onward)   Start     Dose/Rate Route Frequency Ordered Stop   11/06/17 0600  cefTRIAXone (ROCEPHIN) 2 g in dextrose 5 % 50 mL IVPB  Status:  Discontinued     2 g 100 mL/hr over 30 Minutes Intravenous On call to O.R. 11/05/17 0826 11/05/17 0900   11/05/17 1000  [MAR Hold]  cefTRIAXone (ROCEPHIN) 2 g in dextrose 5 % 50 mL IVPB     (MAR Hold since 11/06/17 1243)   2 g 100 mL/hr over 30 Minutes Intravenous  Every 24 hours 11/05/17 0900       Subjective: Seen and examined at bedside and still complaining of RUQ Pain but now stating she has worsening Right Flank Pain. States she has had a Hx of kidney stones but does not know if she "slept wrong." No Nausea today. No other concerns or complaints at this time.   Objective: Vitals:   11/05/17 1328 11/05/17 2121 11/06/17 0503 11/06/17 1245  BP: 122/70 114/64 116/80   Pulse: (!) 56 (!) 50 (!) 52   Resp:  18 16   Temp: 98.7 F (37.1 C) 98.1 F (36.7 C) 98.1 F (36.7 C)   TempSrc: Oral Oral Oral   SpO2: 99% 100% 100%   Weight:    74.6 kg (164 lb 7.4 oz)  Height:    5' 9.02" (1.753 m)    Intake/Output Summary (Last 24 hours) at 11/06/2017 1307 Last data filed at 11/05/2017 1700 Gross per 24 hour  Intake 460 ml  Output 600 ml  Net -140 ml   Filed Weights   11/02/17 1822 11/03/17 0035 11/06/17 1245  Weight: 72.6 kg (160 lb) 74.6 kg (164 lb 7.4 oz) 74.6 kg (164 lb 7.4 oz)   Examination: Physical Exam:  Constitutional: WN/WD Caucasian female in NAD appears calm  Eyes: Sclerae anicteric. Lids normal ENMT: External Ears and nose appear normal. MMM. Poor dentition Neck: Supple with no JVD; External Ears and nose appear normal  Respiratory: CTAB; No appreciable wheezing/rales/rhonchi; Unlabored breathing  Cardiovascular: RRR; No appreciable m/r/g. No extremity edema Abdomen: Soft, Tender to palpate in RUQ. Has flank pain. Patient's Abd is ND. Bowel sounds present GU: Deferred. Musculoskeletal: No contracture or cyanosis Skin: Warm and dry. No rashes or lesions on a limited skin eval Neurologic: CN 2-12 grossly intact. No appreciable focal deficits Psychiatric: Normal mood and affect. Intact judgement and insight  Data Reviewed: I have personally reviewed following labs and imaging studies  CBC: Recent Labs  Lab 11/02/17 2021 11/03/17 0709 11/04/17 0758 11/05/17 0527 11/06/17 0605  WBC 6.4 4.6 4.1 4.6 4.8  NEUTROABS  --   --   2.2 2.2 2.1  HGB 12.7 11.3* 12.1 12.1 12.7  HCT 38.0 35.3* 37.1 36.1 38.4  MCV 85.6 86.7 84.3 84.1 84.2  PLT 218 167 167 166 622   Basic Metabolic Panel: Recent Labs  Lab 11/02/17 2021 11/03/17 0709 11/04/17 0758 11/05/17 0527 11/06/17 0605  NA 140 140 138 137 136  K 3.5 3.8 3.8 3.7 3.7  CL 106 112* 107 106 103  CO2 27 23 24 22 26   GLUCOSE 98 102* 91 96 93  BUN 7 6 5* 5* <5*  CREATININE 0.67 0.70 0.62 0.59 0.66  CALCIUM 8.9 8.1* 8.6* 8.4* 9.0  MG  --   --  2.2 2.1 2.2  PHOS  --   --  3.8 3.9 4.9*   GFR: Estimated Creatinine  Clearance: 107.5 mL/min (by C-G formula based on SCr of 0.66 mg/dL). Liver Function Tests: Recent Labs  Lab 11/02/17 2021 11/03/17 0709 11/04/17 0758 11/05/17 0527 11/06/17 0605  AST 166* 129* 203* 186* 181*  ALT 424* 337* 407* 404* 412*  ALKPHOS 123 91 111 110 119  BILITOT 0.7 0.8 0.8 0.7 0.6  PROT 7.1 6.0* 6.6 6.6 6.8  ALBUMIN 3.7 3.3* 3.5 3.3* 3.6   Recent Labs  Lab 11/02/17 2021 11/06/17 0605  LIPASE 88* 53*   No results for input(s): AMMONIA in the last 168 hours. Coagulation Profile: Recent Labs  Lab 11/02/17 2155  INR 1.35   Cardiac Enzymes: No results for input(s): CKTOTAL, CKMB, CKMBINDEX, TROPONINI in the last 168 hours. BNP (last 3 results) No results for input(s): PROBNP in the last 8760 hours. HbA1C: No results for input(s): HGBA1C in the last 72 hours. CBG: No results for input(s): GLUCAP in the last 168 hours. Lipid Profile: No results for input(s): CHOL, HDL, LDLCALC, TRIG, CHOLHDL, LDLDIRECT in the last 72 hours. Thyroid Function Tests: No results for input(s): TSH, T4TOTAL, FREET4, T3FREE, THYROIDAB in the last 72 hours. Anemia Panel: No results for input(s): VITAMINB12, FOLATE, FERRITIN, TIBC, IRON, RETICCTPCT in the last 72 hours. Sepsis Labs: No results for input(s): PROCALCITON, LATICACIDVEN in the last 168 hours.  Recent Results (from the past 240 hour(s))  Surgical pcr screen     Status: None    Collection Time: 11/04/17  8:37 PM  Result Value Ref Range Status   MRSA, PCR NEGATIVE NEGATIVE Final   Staphylococcus aureus NEGATIVE NEGATIVE Final    Comment: (NOTE) The Xpert SA Assay (FDA approved for NASAL specimens in patients 23 years of age and older), is one component of a comprehensive surveillance program. It is not intended to diagnose infection nor to guide or monitor treatment.     Radiology Studies: No results found. Scheduled Meds: . [MAR Hold] enoxaparin (LOVENOX) injection  40 mg Subcutaneous Daily  . [MAR Hold] nicotine  21 mg Transdermal Daily   Continuous Infusions: . [MAR Hold] cefTRIAXone (ROCEPHIN)  IV Stopped (11/06/17 0940)  . dextrose 5% lactated ringers with KCl 20 mEq/L 125 mL/hr at 11/06/17 0906  . lactated ringers 10 mL/hr at 11/06/17 1245    LOS: 2 days   Kerney Elbe, DO [facility] Pager [phone]  If 7PM-7AM, please contact night-coverage [email or web address] Password [email or web address] 11/06/2017, 1:07 PM

## 2017-11-07 ENCOUNTER — Encounter (HOSPITAL_COMMUNITY): Payer: Self-pay | Admitting: Surgery

## 2017-11-07 ENCOUNTER — Inpatient Hospital Stay (HOSPITAL_COMMUNITY): Payer: Medicaid Other

## 2017-11-07 LAB — COMPREHENSIVE METABOLIC PANEL
ALBUMIN: 4.1 g/dL (ref 3.5–5.0)
ALK PHOS: 127 U/L — AB (ref 38–126)
ALT: 348 U/L — AB (ref 14–54)
AST: 108 U/L — ABNORMAL HIGH (ref 15–41)
Anion gap: 9 (ref 5–15)
BILIRUBIN TOTAL: 0.7 mg/dL (ref 0.3–1.2)
BUN: 5 mg/dL — ABNORMAL LOW (ref 6–20)
CALCIUM: 9.6 mg/dL (ref 8.9–10.3)
CO2: 27 mmol/L (ref 22–32)
CREATININE: 0.78 mg/dL (ref 0.44–1.00)
Chloride: 101 mmol/L (ref 101–111)
GFR calc non Af Amer: >60 mL/min (ref >60)
GLUCOSE: 106 mg/dL — AB (ref 65–99)
Potassium: 4.1 mmol/L (ref 3.5–5.1)
SODIUM: 137 mmol/L (ref 135–145)
TOTAL PROTEIN: 7.7 g/dL (ref 6.5–8.1)

## 2017-11-07 LAB — CBC WITH DIFFERENTIAL/PLATELET
Basophils Absolute: 0 10*3/uL (ref 0.0–0.1)
Basophils Relative: 0 %
EOS ABS: 0 10*3/uL (ref 0.0–0.7)
Eosinophils Relative: 0 %
HEMATOCRIT: 42.3 % (ref 36.0–46.0)
HEMOGLOBIN: 13.9 g/dL (ref 12.0–15.0)
LYMPHS ABS: 1.7 10*3/uL (ref 0.7–4.0)
Lymphocytes Relative: 19 %
MCH: 27.9 pg (ref 26.0–34.0)
MCHC: 32.9 g/dL (ref 30.0–36.0)
MCV: 84.9 fL (ref 78.0–100.0)
MONOS PCT: 5 %
Monocytes Absolute: 0.5 10*3/uL (ref 0.1–1.0)
NEUTROS ABS: 6.7 10*3/uL (ref 1.7–7.7)
NEUTROS PCT: 76 %
Platelets: 231 10*3/uL (ref 150–400)
RBC: 4.98 MIL/uL (ref 3.87–5.11)
RDW: 15.3 % (ref 11.5–15.5)
WBC: 8.8 10*3/uL (ref 4.0–10.5)

## 2017-11-07 LAB — URINE CULTURE: Culture: NO GROWTH

## 2017-11-07 LAB — MAGNESIUM: Magnesium: 2.2 mg/dL (ref 1.7–2.4)

## 2017-11-07 LAB — PHOSPHORUS: Phosphorus: 4.6 mg/dL (ref 2.5–4.6)

## 2017-11-07 MED ORDER — PROMETHAZINE HCL 25 MG/ML IJ SOLN
12.5000 mg | Freq: Once | INTRAMUSCULAR | Status: AC
  Administered 2017-11-07: 12.5 mg via INTRAVENOUS
  Filled 2017-11-07: qty 1

## 2017-11-07 MED ORDER — MORPHINE SULFATE (PF) 4 MG/ML IV SOLN
1.0000 mg | INTRAVENOUS | Status: DC | PRN
  Administered 2017-11-07: 1 mg via INTRAVENOUS
  Filled 2017-11-07: qty 1

## 2017-11-07 MED ORDER — MORPHINE SULFATE (PF) 4 MG/ML IV SOLN
1.0000 mg | INTRAVENOUS | Status: DC | PRN
  Administered 2017-11-07 – 2017-11-08 (×5): 1 mg via INTRAVENOUS
  Filled 2017-11-07 (×5): qty 1

## 2017-11-07 MED ORDER — IOPAMIDOL (ISOVUE-300) INJECTION 61%
INTRAVENOUS | Status: AC
Start: 2017-11-07 — End: 2017-11-07
  Administered 2017-11-07: 100 mL
  Filled 2017-11-07: qty 100

## 2017-11-07 MED ORDER — KETOROLAC TROMETHAMINE 30 MG/ML IJ SOLN
30.0000 mg | Freq: Four times a day (QID) | INTRAMUSCULAR | Status: DC | PRN
  Administered 2017-11-07 – 2017-11-08 (×3): 30 mg via INTRAVENOUS
  Filled 2017-11-07 (×3): qty 1

## 2017-11-07 MED ORDER — GUAIFENESIN-DM 100-10 MG/5ML PO SYRP
10.0000 mL | ORAL_SOLUTION | ORAL | Status: DC | PRN
  Administered 2017-11-07 – 2017-11-08 (×3): 10 mL via ORAL
  Filled 2017-11-07 (×3): qty 10

## 2017-11-07 MED ORDER — MENTHOL 3 MG MT LOZG
1.0000 | LOZENGE | OROMUCOSAL | Status: DC | PRN
  Administered 2017-11-07: 3 mg via ORAL
  Filled 2017-11-07: qty 9

## 2017-11-07 NOTE — Progress Notes (Signed)
PROGRESS NOTE    Yesenia Bennett  IRS:[id number] DOB: Mar 05, 1986 DOA: 11/02/2017 PCP: System, Pcp Not In   Brief Narrative:  HPI Per Dr. Elesa Hacker is a 31 y.o. female with medical history significant of Heroin Abuse, hepatitis C, anxiety, depression, PTSD, and cervical cancer diagnosed 2010; resents with complaints of right upper quadrant pain for the last 2 weeks. Describes the pain as a stabbing and cramping pain mostly in the right upper quadrant that radiates to her shoulder. Eating any food seemed to worsen symptoms.  Associated symptoms include complaints of chills, nausea, nonbloody emesis, poor p.o. intake, heartburn, and dizziness.  Patient was previously seen at [facility] on 12/13 with complaints of nausea and vomiting.  Review of records shows that patient was evaluated and seen to have elevated ALT  267, AST 139, and lipase 41.  It appeared she underwent CT scan which showed no acute abnormalities and abdominal ultrasound which showed a small amount of sludge within the gallbladder and mild splenomegaly.  Notes that she has had she just recently completed Heroin rehab at [facility], and was the reason why she did not stay at when previously evaluated.  Currently, not on any treatment for her previous history of mood disorders.  Upon admission into the emergency room patient was seen to be afebrile with vital signs relatively within normal limits.  Labs revealed AST 166, ALT 424, lipase 88, total bilirubin 0.7, and all other labs relatively within normal limits.  Chest x-ray was unremarkable on right upper quadrant ultrasound showed signs of mild gallbladder wall thickening without signs of gallstones.  She received 2 L of normal saline IV fluids.  General surgery was consulted, but recommended GI evaluation first as does not appear patient needs a emergent cholecystectomy at this time.  [facility] physician on call was consulted and recommended adding on ethanol level, acetaminophen,  hepatitis panel, and INR.  TRH called to admit.  Patient still having RUQ Pain. Gastroenterology evaluated and recommend against MRCP and recommending HIDA scan and Surgical Consultation. Surgery evaluated and took the patient for Laparoscopic Cholecystectomy 11/06/17. Still complaining of Abdominal Pain and Nauseous so will get CT Abd/Pelvis to evaluate.   Assessment & Plan:   Principal Problem:   RUQ pain Active Problems:   Elevated LFTs   Elevated lipase   Urinary urgency   Chronic hepatitis C without hepatic coma (HCC)   Subclinical hypothyroidism   Mood disorder in conditions classified elsewhere   Polysubstance abuse (HCC)   Chronic cholecystitis   Hyperphosphatemia  Right Upper Quadrant Abdominal Pain associated with evated Lipase, elevated LFTs in the setting of Chronic Cholecystitis s/p Lap Chole POD 1 -Patient presented with complaints of worsening right upper quadrant abdominal pain with radiation to her shoulder.   -Lipase was 88 on Admission, AST was 166, ALT was 424 -Repeat Lipase was 53; AST and ALT remain elevated  -Ultrasound showed mild signs of gallbladder wall thickening suspected secondary to contraction and no gallstones. Due to the elevation in liver enzymes and lipase question the possibility of a large gallstone.  -Gastroenterology consulted and evaluated and feel like cause of pain is difficult to determine and feel there is no convincing evidence of Acute Cholecystitis and do not feel these are typical for Peptic Ulcer so are not pursuing further endoscopic procedures.  -Admitted to a MedSurg bed -Continue to Monitor intake and output -IVF Stopped yesterday but restarted at LR + 20 KCl at 125 mLhr  -Morphine IV prn pain had  been D/C'd and started IV Ketorolac and renewed but IV Morphine restarted by General Surgery Team and changed to 1 mg q3hprn  -Acetaminophen Level was <10, PT-INR was 16.6-1.35, and EtOH level was <10  -Gastroenterology recommending against  MRCP and recommend obtaining a HIDA Scan -HIDA showed No evidence of cystic duct obstruction to confirm acute cholecystitis. The gallbladder is visible after IV morphine administration. Nonvisualization of the gallbladder out to 1 hour 20 minutes prior to IV morphine administration most likely indicates chronic cholecystitis.  Mild tracer retention by the liver indicating mild  Hepatocellular disease, which is compatible with the given history of hepatitis-C and elevated liver function tests -Hx of being on Suboxone 8-2 mg Film SL BID and has been a Heroin Addict  -General Surgery started patient on IV Morphine and changed to 1 mg q3hprn, Tramadol 50 mg po q6hprn for moderate pain was increased to 100 mg po q6hprn by General Surgery,  -Prophylactic IV Abx with Ceftriaxone 2 grams q24h now D/C'd -Diet NPO for Surgery yesterday and advanced to Regular Diet per General Surgery -C/w Zofran for Nausea  -Repeat CMP in AM  -Patient continues to be nauseous and having Abdominal Pain; Will get CT Abd/Pelvis  Urinary Urgency and now Flank Pain -Acute.  Patient reports having urinary urgency and pressure symptoms. -Initial Urinalysis and showed Hazy Appearance, Amber Color, Large Hb, 6-30 RBC, 0-5 WBC and Negative Leukocytes and Nitrites -Repeat Urinalysis and Urine Cx -IVF restarted by General Surgery at [facility] + 20 KCl @ 125 mL/hr -C/w Ketorolac 30 mg q6hprn  -Continue to Monitor and will evaluate for Nephrolithiasis with CT Abd/Pelvis w/ Contrast  Hepatitis C -Records show diagnosed with hepatitis C Genotype  1a  back in 2015-2016, and has not had treatment for this at this time.  -AST went from 166 -> 129 -> 203 -> 186 -> 181 -> 108 -ALT went from 424 -> 337 -> 407 -> 404 -> 412 -> 348 -Follow-up Repeat Hepatitis Panel shows Hep C Ab >11.0 -Per Gastroenterology not a Candidate for Treatment currently but will need to be referred to Infectious Disease Clinic after D/C   History of Subclinical  Hypothyroidism -Previously documented back in 2016. -Checked TSH and was 0.404  History of Polysubstance Abuse including Opioid Abuse  -Previous history of Heroin Abuse -Avoid Narcotics as much as possible and had D/C'd IV Morphine but restarted by General Surgery for Pain control. C/w 1 mg q3hprn and try to get off of as soon as possible -General Surgery also started Tramadol 50 mg po q6hprn but increased it to 100 mg po q6hprn -Still current uses  tobacco, and unclear about other drugs. -C/w Nicotine Patch 21 mg TD q24h for Tobacco Abuse  Mood Disorder History -Patient with previous history of PTSD, Anxiety, Depression, other diagnoses -Currently not on any treatment for these at this time but review shows she has been on Citalopram 40 mg po Daily and Oxcarbazepine 300 mg po BID  Normocytic Anemia -Likely dilutional Drop though Urinalysis did show some blood -Hb/Hct stable at 13.9/42.3 -Continue to Monitor for S/Sx of Bleeding as patient has Hx of Cervical Cancer and Hematuria with Aspirin, and Ibuprofen  -Repeat CBC in AM   Hx of Cervical Cancer -Follow up with Oncology as an outpatient  Hx of Herpes Simplex -Review of Records from [place] show patient has been on Valacyclovir in the past  Hyperphosphatemia -Patient;s Phos Level was 4.9 and improved to 4.6 -Continue to Monitor and Repeat Phos Level in  AM  DVT prophylaxis: Enoxaparin 40 mg sq q24h Code Status: FULL CODE Family Communication: Discussed with boyfriend at bedside Disposition Plan: Patient to undergo Laparoscopic vs. Open Cholecystectomy today   Consultants:   Gastroenterology  General Surgery    Procedures:  HIDA Scan   Lap Chole 11/06/17   Antimicrobials:  Anti-infectives (From admission, onward)   Start     Dose/Rate Route Frequency Ordered Stop   11/06/17 0600  cefTRIAXone (ROCEPHIN) 2 g in dextrose 5 % 50 mL IVPB  Status:  Discontinued     2 g 100 mL/hr over 30 Minutes Intravenous On call to  O.R. 11/05/17 0826 11/05/17 0900   11/05/17 1000  cefTRIAXone (ROCEPHIN) 2 g in dextrose 5 % 50 mL IVPB  Status:  Discontinued     2 g 100 mL/hr over 30 Minutes Intravenous Every 24 hours 11/05/17 0900 11/06/17 1537     Subjective: Seen and examined at bedside and was having intractable Abdominal Pain and Nausea. No CP but states she did not have a goodnight. No lightheadedness but was in tears from the pain.    Objective: Vitals:   11/06/17 1530 11/06/17 2142 11/07/17 0447 11/07/17 1358  BP: 137/74 116/66 114/70 124/70  Pulse: 62 70 60 66  Resp: 18 16 16 20   Temp: 98.4 F (36.9 C) 98.9 F (37.2 C) 98.2 F (36.8 C) 98.4 F (36.9 C)  TempSrc: Oral Oral Oral Oral  SpO2: 100% 98% 99% 100%  Weight:      Height:        Intake/Output Summary (Last 24 hours) at 11/07/2017 1657 Last data filed at 11/07/2017 0100 Gross per 24 hour  Intake 2227.5 ml  Output -  Net 2227.5 ml   Filed Weights   11/02/17 1822 11/03/17 0035 11/06/17 1245  Weight: 72.6 kg (160 lb) 74.6 kg (164 lb 7.4 oz) 74.6 kg (164 lb 7.4 oz)   Examination: Physical Exam:  Constitutional: WN/WD Caucasian female who is uncomfortable complaining of severe pain Eyes: Tearful but sclerae anicteric. Lids normal ENMT: External Ears and nose appear normal. MMM; Has poor dentition Neck: Supple with no JVD Respiratory: CTAB; No appreciable wheezing/rales/rhonchi. Unlabored Breathing Cardiovascular: Slightly tachycardic. S1, S2. No extremity edema Abdomen: Soft, Tender to palpate abdomen; Slightly distended. Abdominal Incisions appear C/D/I GU: Deferred Musculoskeletal: No contractures; no cyanosis Skin: Warm and dry. No rashes or lesions on a limited skin eval Neurologic: CN 2-12 grossly intact. No appreciable focal deficits Psychiatric: Tearful mood and affect. Intact judgement and insight  Data Reviewed: I have personally reviewed following labs and imaging studies  CBC: Recent Labs  Lab 11/03/17 0709  11/04/17 0758 11/05/17 0527 11/06/17 0605 11/07/17 0453  WBC 4.6 4.1 4.6 4.8 8.8  NEUTROABS  --  2.2 2.2 2.1 6.7  HGB 11.3* 12.1 12.1 12.7 13.9  HCT 35.3* 37.1 36.1 38.4 42.3  MCV 86.7 84.3 84.1 84.2 84.9  PLT 167 167 166 186 562   Basic Metabolic Panel: Recent Labs  Lab 11/03/17 0709 11/04/17 0758 11/05/17 0527 11/06/17 0605 11/07/17 0453  NA 140 138 137 136 137  K 3.8 3.8 3.7 3.7 4.1  CL 112* 107 106 103 101  CO2 23 24 22 26 27   GLUCOSE 102* 91 96 93 106*  BUN 6 5* 5* <5* 5*  CREATININE 0.70 0.62 0.59 0.66 0.78  CALCIUM 8.1* 8.6* 8.4* 9.0 9.6  MG  --  2.2 2.1 2.2 2.2  PHOS  --  3.8 3.9 4.9* 4.6   GFR: Estimated  Creatinine Clearance: 107.5 mL/min (by C-G formula based on SCr of 0.78 mg/dL). Liver Function Tests: Recent Labs  Lab 11/03/17 0709 11/04/17 0758 11/05/17 0527 11/06/17 0605 11/07/17 0453  AST 129* 203* 186* 181* 108*  ALT 337* 407* 404* 412* 348*  ALKPHOS 91 111 110 119 127*  BILITOT 0.8 0.8 0.7 0.6 0.7  PROT 6.0* 6.6 6.6 6.8 7.7  ALBUMIN 3.3* 3.5 3.3* 3.6 4.1   Recent Labs  Lab 11/02/17 2021 11/06/17 0605  LIPASE 88* 53*   No results for input(s): AMMONIA in the last 168 hours. Coagulation Profile: Recent Labs  Lab 11/02/17 2155  INR 1.35   Cardiac Enzymes: No results for input(s): CKTOTAL, CKMB, CKMBINDEX, TROPONINI in the last 168 hours. BNP (last 3 results) No results for input(s): PROBNP in the last 8760 hours. HbA1C: No results for input(s): HGBA1C in the last 72 hours. CBG: No results for input(s): GLUCAP in the last 168 hours. Lipid Profile: No results for input(s): CHOL, HDL, LDLCALC, TRIG, CHOLHDL, LDLDIRECT in the last 72 hours. Thyroid Function Tests: No results for input(s): TSH, T4TOTAL, FREET4, T3FREE, THYROIDAB in the last 72 hours. Anemia Panel: No results for input(s): VITAMINB12, FOLATE, FERRITIN, TIBC, IRON, RETICCTPCT in the last 72 hours. Sepsis Labs: No results for input(s): PROCALCITON, LATICACIDVEN in the  last 168 hours.  Recent Results (from the past 240 hour(s))  Surgical pcr screen     Status: None   Collection Time: 11/04/17  8:37 PM  Result Value Ref Range Status   MRSA, PCR NEGATIVE NEGATIVE Final   Staphylococcus aureus NEGATIVE NEGATIVE Final    Comment: (NOTE) The Xpert SA Assay (FDA approved for NASAL specimens in patients 47 years of age and older), is one component of a comprehensive surveillance program. It is not intended to diagnose infection nor to guide or monitor treatment.   Culture, Urine     Status: None   Collection Time: 11/06/17 12:10 PM  Result Value Ref Range Status   Specimen Description URINE, CLEAN CATCH  Final   Special Requests NONE  Final   Culture NO GROWTH  Final   Report Status 11/07/2017 FINAL  Final    Radiology Studies: No results found. Scheduled Meds: . docusate sodium  100 mg Oral BID  . enoxaparin (LOVENOX) injection  40 mg Subcutaneous Daily  . nicotine  21 mg Transdermal Daily   Continuous Infusions: . lactated ringers 10 mL/hr at 11/07/17 1423    LOS: 3 days   Kerney Elbe, DO [facility] Pager [phone]  If 7PM-7AM, please contact night-coverage [email or web address] Password [facility] 11/07/2017, 4:57 PM

## 2017-11-07 NOTE — Social Work (Signed)
CSW consulted for food voucher for pt partner, CSW met with pt and was able to complete assessment. CSW will provide pt partner with food voucher today and contact weekend CSW about voucher tomorrow if not discharged.   Pt had concerns about discharging out since pt and pt partner are homeless. CSW will return with shelter information and information about the [facility] for pt and pt partner.  CSW continuing to follow to support discharge when medically appropriate.   [place], [place] Work [phone]

## 2017-11-07 NOTE — Clinical Social Work Note (Signed)
Clinical Social Work Assessment  Patient Details  Name: Yesenia Bennett MRN: [id number] Date of Birth: 1986/03/14  Date of referral:  11/07/17               Reason for consult:  Housing Concerns/Homelessness, [employer]                Permission sought to share information with:  [employer], Other Permission granted to share information::  Yes, Verbal Permission Granted  Name::     Yesenia Bennett  Agency::  [facility]  Relationship::  partner  Contact Information:     Housing/Transportation Living arrangements for the past 2 months:  No permanent address, Homeless Source of Information:  Patient Patient Interpreter Needed:  None Criminal Activity/Legal Involvement Pertinent to Current Situation/Hospitalization:  No - Comment as needed Significant Relationships:  Parents, Spouse Lives with:  Spouse Do you feel safe going back to the place where you live?  No Need for family participation in patient care:  Yes (Comment)  Care giving concerns:  Pt is not from [facility] and is currently experiencing homelessness. Pt has hx of drug use and not accessing medications.   Social Worker assessment / plan:  CSW met with pt at bedside, pt expressed that her partner had not had a meal since she was admitted to the hospital. Pt and partner are currently homeless and living in [place], mostly in and around places in downtown [place]. Pt is not from the area and was not aware of many of the resources available to people experiencing homelessness. Pt requested CSW bring her partner a food voucher if possible and any resources for when she is discharged. Pt is concerned about her health when leaving the hospital but states that she would be okay with resources that CSW could provide.   CSW provided pt with food voucher and left materials for weeked CSW to leave with pt.   Employment status:  Unemployed Forensic scientist:  Medicaid In [place] PT Recommendations:  No Follow Up Information  / Referral to community resources:  Other (Comment Required), Shelter  Patient/Family's Response to care:  Pt grateful for CSW support and resources.  Patient/Family's Understanding of and Emotional Response to Diagnosis, Current Treatment, and Prognosis:  Pt was tearful and emotional during conversation with CSW. Pt understands her diagnosis, current treatment and prognosis, and is eager for any resources and support that care team can provide before discharge.  Emotional Assessment Appearance:  Appears stated age Attitude/Demeanor/Rapport:  Crying(Cooperative) Affect (typically observed):  Sad, Tearful/Crying(Cooperative) Orientation:  Oriented to Self, Oriented to Place, Oriented to  Time, Oriented to Situation Alcohol / Substance use:  Tobacco Use, Illicit Drugs(Current Some Day Smoker; heroin) Psych involvement (Current and /or in the community):  No (Comment)  Discharge Needs  Concerns to be addressed:  Financial / Insurance Concerns, Homelessness, Discharge Planning Concerns Readmission within the last 30 days:  No Current discharge risk:  Homeless, Substance Abuse, Chronically ill Barriers to Discharge:  Homeless with medical needs, Continued Medical Work up   [employer], No Name 11/07/2017, 1:02 PM

## 2017-11-07 NOTE — Progress Notes (Signed)
1 Day Post-Op    CC: Abdominal pain  Subjective: Patient continues to complain of pain.  Has taken some liquids without issue.  She reports she is homeless.  Port sites all look good, abdomen is soft no distention.  Objective: Vital signs in last 24 hours: Temp:  [97.6 F (36.4 C)-98.9 F (37.2 C)] 98.2 F (36.8 C) (12/28 0447) Pulse Rate:  [53-70] 60 (12/28 0447) Resp:  [14-20] 16 (12/28 0447) BP: (114-137)/(66-86) 114/70 (12/28 0447) SpO2:  [97 %-100 %] 99 % (12/28 0447) Weight:  [74.6 kg (164 lb 7.4 oz)] 74.6 kg (164 lb 7.4 oz) (12/27 1245) Last BM Date: 11/05/17 240 PO 3137 IV Voided x 6 Afebrile, VSS LFT's better WBC is normal  Urine culture still peniding  Intake/Output from previous day: 12/27 0701 - 12/28 0700 In: 3377.5 [P.O.:240; I.V.:3137.5] Out: 10 [Blood:10] Intake/Output this shift: No intake/output data recorded.  General appearance: alert, cooperative and Continues to appear sad and teary, continues to complain of abdominal pain. GI: Soft, no distention, port sites all look fine.  Lab Results:  Recent Labs    11/06/17 0605 11/07/17 0453  WBC 4.8 8.8  HGB 12.7 13.9  HCT 38.4 42.3  PLT 186 231    BMET Recent Labs    11/06/17 0605 11/07/17 0453  NA 136 137  K 3.7 4.1  CL 103 101  CO2 26 27  GLUCOSE 93 106*  BUN <5* 5*  CREATININE 0.66 0.78  CALCIUM 9.0 9.6   PT/INR No results for input(s): LABPROT, INR in the last 72 hours.  Recent Labs  Lab 11/03/17 0709 11/04/17 0758 11/05/17 0527 11/06/17 0605 11/07/17 0453  AST 129* 203* 186* 181* 108*  ALT 337* 407* 404* 412* 348*  ALKPHOS 91 111 110 119 127*  BILITOT 0.8 0.8 0.7 0.6 0.7  PROT 6.0* 6.6 6.6 6.8 7.7  ALBUMIN 3.3* 3.5 3.3* 3.6 4.1     Lipase     Component Value Date/Time   LIPASE 53 (H) 11/06/2017 7078     Medications: . docusate sodium  100 mg Oral BID  . enoxaparin (LOVENOX) injection  40 mg Subcutaneous Daily  . nicotine  21 mg Transdermal Daily   . dextrose  5% lactated ringers with KCl 20 mEq/L 125 mL/hr at 11/07/17 0637  . lactated ringers 10 mL/hr at 11/06/17 1245    Assessment/Plan RUQ abdominal pain with elevated lipase and elevated LFTs - lipase 88 on 12/23 - AST/ALT 186/404, Tbili 0.7, Alk Phos 110 Chronic cholecystitis -HIDA gallbladder did not fill until morphine administration concerning for chronic cholecystitis --Laparoscopic cholecystectomy 11/06/17, Dr. Nadeen Landau  Homeless Hepatitis C Subclinical hypothyroidism PSA including opioids Mood disorder hx Normocytic anemia Hx of cervical cancer Hx of herpes simplex Urinary urgency  FEN: full liquids/ IV fluids VTE: SCDs, hold lovenox tomorrow ID: no current abx  Plan: From our standpoint we can advance her diet, saline lock her IVs, mobilize, and when tolerating discomfort with just p.o. pain medications can be discharged home.  We will arrange follow-up in our office in about 2 weeks.  I will put follow up information and DC instructions in AVS. She vomited breakfast so would keep her at least another 24 hours till she settles out.  I saw her up walking later, making some progress.         LOS: 3 days    Yesenia Bennett 11/07/2017 [phone]

## 2017-11-08 ENCOUNTER — Inpatient Hospital Stay (HOSPITAL_COMMUNITY): Payer: Medicaid Other

## 2017-11-08 DIAGNOSIS — N119 Chronic tubulo-interstitial nephritis, unspecified: Secondary | ICD-10-CM

## 2017-11-08 LAB — MAGNESIUM: MAGNESIUM: 2.2 mg/dL (ref 1.7–2.4)

## 2017-11-08 LAB — COMPREHENSIVE METABOLIC PANEL
ALT: 239 U/L — ABNORMAL HIGH (ref 14–54)
ANION GAP: 9 (ref 5–15)
AST: 72 U/L — ABNORMAL HIGH (ref 15–41)
Albumin: 3.3 g/dL — ABNORMAL LOW (ref 3.5–5.0)
Alkaline Phosphatase: 110 U/L (ref 38–126)
BUN: 7 mg/dL (ref 6–20)
CHLORIDE: 103 mmol/L (ref 101–111)
CO2: 26 mmol/L (ref 22–32)
Calcium: 9 mg/dL (ref 8.9–10.3)
Creatinine, Ser: 0.76 mg/dL (ref 0.44–1.00)
Glucose, Bld: 85 mg/dL (ref 65–99)
POTASSIUM: 4.3 mmol/L (ref 3.5–5.1)
Sodium: 138 mmol/L (ref 135–145)
Total Bilirubin: 0.7 mg/dL (ref 0.3–1.2)
Total Protein: 5.4 g/dL — ABNORMAL LOW (ref 6.5–8.1)

## 2017-11-08 LAB — CBC WITH DIFFERENTIAL/PLATELET
BASOS ABS: 0 10*3/uL (ref 0.0–0.1)
Basophils Relative: 1 %
EOS PCT: 2 %
Eosinophils Absolute: 0.1 10*3/uL (ref 0.0–0.7)
HCT: 37.9 % (ref 36.0–46.0)
Hemoglobin: 12 g/dL (ref 12.0–15.0)
LYMPHS ABS: 2.4 10*3/uL (ref 0.7–4.0)
LYMPHS PCT: 40 %
MCH: 27.5 pg (ref 26.0–34.0)
MCHC: 31.7 g/dL (ref 30.0–36.0)
MCV: 86.9 fL (ref 78.0–100.0)
MONO ABS: 0.5 10*3/uL (ref 0.1–1.0)
Monocytes Relative: 9 %
Neutro Abs: 2.9 10*3/uL (ref 1.7–7.7)
Neutrophils Relative %: 48 %
PLATELETS: 181 10*3/uL (ref 150–400)
RBC: 4.36 MIL/uL (ref 3.87–5.11)
RDW: 15.9 % — AB (ref 11.5–15.5)
WBC: 6 10*3/uL (ref 4.0–10.5)

## 2017-11-08 LAB — PHOSPHORUS: PHOSPHORUS: 4.8 mg/dL — AB (ref 2.5–4.6)

## 2017-11-08 MED ORDER — GUAIFENESIN ER 600 MG PO TB12
1200.0000 mg | ORAL_TABLET | Freq: Two times a day (BID) | ORAL | Status: DC
  Administered 2017-11-08 – 2017-11-10 (×5): 1200 mg via ORAL
  Filled 2017-11-08 (×7): qty 2

## 2017-11-08 MED ORDER — MORPHINE SULFATE (PF) 4 MG/ML IV SOLN
2.0000 mg | INTRAVENOUS | Status: DC | PRN
  Administered 2017-11-08 – 2017-11-09 (×8): 2 mg via INTRAVENOUS
  Filled 2017-11-08 (×8): qty 1

## 2017-11-08 MED ORDER — DEXTROSE 5 % IV SOLN
1.0000 g | INTRAVENOUS | Status: DC
  Administered 2017-11-08 – 2017-11-11 (×4): 1 g via INTRAVENOUS
  Filled 2017-11-08 (×4): qty 10

## 2017-11-08 MED ORDER — POTASSIUM CHLORIDE IN NACL 20-0.9 MEQ/L-% IV SOLN
INTRAVENOUS | Status: DC
  Administered 2017-11-08 – 2017-11-09 (×2): via INTRAVENOUS
  Filled 2017-11-08 (×2): qty 1000

## 2017-11-08 MED ORDER — PROMETHAZINE HCL 25 MG/ML IJ SOLN
12.5000 mg | Freq: Four times a day (QID) | INTRAMUSCULAR | Status: DC | PRN
  Administered 2017-11-08 – 2017-11-09 (×3): 12.5 mg via INTRAVENOUS
  Filled 2017-11-08 (×3): qty 1

## 2017-11-08 NOTE — Progress Notes (Signed)
CSW spoke with patient and her fiance at bedside to provide housing and shelter resources. CSW also provided a food voucher to patient's spouse. They had no further concerns.  CSW signing off. Please re-consult if patient requires a bus pass at discharge.  Percell Locus Marieelena Bartko LCSWA [phone]

## 2017-11-08 NOTE — Progress Notes (Signed)
2 Days Post-Op    CC:  Abdominal pain  Subjective: Still complaining of soreness and nausea, wants to talk with case worker, reports again that she is homeless.   Objective: Vital signs in last 24 hours: Temp:  [98.2 F (36.8 C)-98.6 F (37 C)] 98.2 F (36.8 C) (12/29 0558) Pulse Rate:  [45-66] 45 (12/29 0558) Resp:  [18-20] 18 (12/29 0558) BP: (112-124)/(70-71) 118/71 (12/29 0558) SpO2:  [98 %-100 %] 99 % (12/29 0558) Last BM Date: 11/05/17 240 PO recorded\ Urine x 5 Emesis x 2 Afebrile, VSS LFT's continue to improve WBC is normal  Intake/Output from previous day: 12/28 0701 - 12/29 0700 In: 240 [P.O.:240] Out: -  Intake/Output this shift: No intake/output data recorded.  General appearance: alert, cooperative and no distress GI: soft, sore, port sites look fine.  Lab Results:  Recent Labs    11/07/17 0453 11/08/17 0630  WBC 8.8 6.0  HGB 13.9 12.0  HCT 42.3 37.9  PLT 231 181    BMET Recent Labs    11/07/17 0453 11/08/17 0630  NA 137 138  K 4.1 4.3  CL 101 103  CO2 27 26  GLUCOSE 106* 85  BUN 5* 7  CREATININE 0.78 0.76  CALCIUM 9.6 9.0   PT/INR No results for input(s): LABPROT, INR in the last 72 hours.  Recent Labs  Lab 11/04/17 0758 11/05/17 0527 11/06/17 0605 11/07/17 0453 11/08/17 0630  AST 203* 186* 181* 108* 72*  ALT 407* 404* 412* 348* 239*  ALKPHOS 111 110 119 127* 110  BILITOT 0.8 0.7 0.6 0.7 0.7  PROT 6.6 6.6 6.8 7.7 5.4*  ALBUMIN 3.5 3.3* 3.6 4.1 3.3*     Lipase     Component Value Date/Time   LIPASE 53 (H) 11/06/2017 0998     Medications: . docusate sodium  100 mg Oral BID  . enoxaparin (LOVENOX) injection  40 mg Subcutaneous Daily  . nicotine  21 mg Transdermal Daily   . lactated ringers 10 mL/hr at 11/07/17 1423   Anti-infectives (From admission, onward)   Start     Dose/Rate Route Frequency Ordered Stop   11/06/17 0600  cefTRIAXone (ROCEPHIN) 2 g in dextrose 5 % 50 mL IVPB  Status:  Discontinued     2 g 100  mL/hr over 30 Minutes Intravenous On call to O.R. 11/05/17 0826 11/05/17 0900   11/05/17 1000  cefTRIAXone (ROCEPHIN) 2 g in dextrose 5 % 50 mL IVPB  Status:  Discontinued     2 g 100 mL/hr over 30 Minutes Intravenous Every 24 hours 11/05/17 0900 11/06/17 1537      Assessment/Plan RUQ abdominal pain with elevated lipase and elevated LFTs - lipase 88 on 12/23 - AST/ALT 186/404, Tbili 0.7, Alk Phos 110 Chronic cholecystitis -HIDA gallbladder did not fill until morphine administration concerning for chronic cholecystitis --Laparoscopic cholecystectomy 11/06/17, Dr. Nadeen Landau  Homeless Hepatitis C Subclinical hypothyroidism PSA including opioids Mood disorder hx/PTSD Normocytic anemia Hx of cervical cancer 2010 Hx of herpes simplex Urinary urgency  FEN: full liquids/ IV fluids VTE: SCDs,  lovenox ID: no current abx   Plan:  No further surgical issues.  We will be available if needed, please call.  Discharge information and follow up is in the AVS. Social worker is seeing her.       LOS: 4 days    Yesenia Bennett 11/08/2017 [phone]

## 2017-11-08 NOTE — Progress Notes (Signed)
PROGRESS NOTE    Yesenia Bennett  DVV:[id number] DOB: 01-08-1986 DOA: 11/02/2017 PCP: System, Pcp Not In   Brief Narrative:  HPI Per Dr. Elesa Hacker is a 31 y.o. female with medical history significant of Heroin Abuse, hepatitis C, anxiety, depression, PTSD, and cervical cancer diagnosed 2010; resents with complaints of right upper quadrant pain for the last 2 weeks. Describes the pain as a stabbing and cramping pain mostly in the right upper quadrant that radiates to her shoulder. Eating any food seemed to worsen symptoms.  Associated symptoms include complaints of chills, nausea, nonbloody emesis, poor p.o. intake, heartburn, and dizziness.  Patient was previously seen at [facility] on 12/13 with complaints of nausea and vomiting.  Review of records shows that patient was evaluated and seen to have elevated ALT  267, AST 139, and lipase 41.  It appeared she underwent CT scan which showed no acute abnormalities and abdominal ultrasound which showed a small amount of sludge within the gallbladder and mild splenomegaly.  Notes that she has had she just recently completed Heroin rehab at [facility], and was the reason why she did not stay at when previously evaluated.  Currently, not on any treatment for her previous history of mood disorders.  Upon admission into the emergency room patient was seen to be afebrile with vital signs relatively within normal limits.  Labs revealed AST 166, ALT 424, lipase 88, total bilirubin 0.7, and all other labs relatively within normal limits.  Chest x-ray was unremarkable on right upper quadrant ultrasound showed signs of mild gallbladder wall thickening without signs of gallstones.  She received 2 L of normal saline IV fluids.  General surgery was consulted, but recommended GI evaluation first as does not appear patient needs a emergent cholecystectomy at this time.  [facility] physician on call was consulted and recommended adding on ethanol level, acetaminophen,  hepatitis panel, and INR.  TRH called to admit.  Patient still having RUQ Pain. Gastroenterology evaluated and recommend against MRCP and recommending HIDA scan and Surgical Consultation. Surgery evaluated and took the patient for Laparoscopic Cholecystectomy 11/06/17. Still complaining of Abdominal Pain and Nauseous so obtained CT Abd/Pelvis to evaluate and showed possible Pyelonephritis. Patient was restarted on IV Abx and IVF.   Assessment & Plan:   Principal Problem:   RUQ pain Active Problems:   Elevated LFTs   Elevated lipase   Urinary urgency   Chronic hepatitis C without hepatic coma (HCC)   Subclinical hypothyroidism   Mood disorder in conditions classified elsewhere   Polysubstance abuse (HCC)   Chronic cholecystitis   Hyperphosphatemia  Right Upper Quadrant Abdominal Pain associated with evated Lipase, elevated LFTs in the setting of Chronic Cholecystitis s/p Lap Chole POD 2 -Patient presented with complaints of worsening right upper quadrant abdominal pain with radiation to her shoulder.   -Lipase was 88 on Admission, AST was 166, ALT was 424 -Repeat Lipase was 53; AST and ALT remain elevated but slowly trending  -U/S showed mild signs of gallbladder wall thickening suspected secondary to contraction and no gallstones. Due to the elevation in liver enzymes and lipase question the possibility of a large gallstone.  -Gastroenterology consulted and evaluated and feel like cause of pain is difficult to determine and feel there is no convincing evidence of Acute Cholecystitis and do not feel these are typical for Peptic Ulcer so are not pursuing further endoscopic procedures.  -Admitted to a MedSurg bed -Continue to Monitor intake and output -IVF Stopped yesterday but restarted  at [facility] + 20 KCl at 125 mLhr  -Morphine IV prn pain had been D/C'd and started IV Ketorolac and renewed but IV Morphine restarted by General Surgery Team and changed to 1 mg q3hprn  -Acetaminophen Level was  <10, PT-INR was 16.6-1.35, and EtOH level was <10  -Gastroenterology recommended against MRCP and recommend obtaining a HIDA Scan -HIDA showed No evidence of cystic duct obstruction to confirm acute cholecystitis. The gallbladder is visible after IV morphine administration. Nonvisualization of the gallbladder out to 1 hour 20 minutes prior to IV morphine administration most likely indicates chronic cholecystitis.  Mild tracer retention by the liver indicating mild  Hepatocellular disease, which is compatible with the given history of hepatitis-C and elevated liver function tests -General Surgery Consulted for HIDA findings.  -Hx of being on Suboxone 8-2 mg Film SL BID and has been a Heroin Addict  -General Surgery started patient on IV Morphine and changed to 2 mg q3hprn, Tramadol 50 mg po q6hprn for moderate pain was increased to 100 mg po q6hprn by General Surgery,  -Prophylactic IV Abx with Ceftriaxone 2 grams q24h restarted given CT Scan findings  -Regular Diet if Patient can tolerate -C/w Zofran for Nausea and Promethazine for Breakthrough   -Repeat CMP in AM  -Patient continues to be nauseous and having Abdominal Pain; CT Scan of Abd/Pelvis showed Trace fluid about the gallbladder fossa, tracking along the right paracolic gutter, and small amount of free fluid seen in the pelvis.This is likely normal status post recent cholecystectomy, though a bile leak cannot be entirely excluded. -If patient's symptoms persist will order HIDA Scan  Urinary Urgency and now Flank Pain - ?Pyelonephritis -Acute.  Patient reports having urinary urgency and pressure symptoms. -Initial Urinalysis and showed Hazy Appearance, Amber Color, Large Hb, 6-30 RBC, 0-5 WBC and Negative Leukocytes and Nitrites -Repeat Urinalysis and Urine Cx; Repeat U/A  -IVF restarted by General Surgery at [facility] + 20 KCl @ 125 mL/hr -D/C'd Ketorolac 30 mg q6hprn  -CT Abd/Pelvis showed Suggestion of decreased cortical enhancement about the  left kidney upper pole of the right kidney. Would correlate clinically for evidence of pyelonephritis. -Restarted IVF as well as IV Abx -Obtain Blood Cx -Check Renal U/S and will considering discussing with Urology in AM  Hepatitis C -Records show diagnosed with hepatitis C Genotype  1a  back in 2015-2016, and has not had treatment for this at this time.  -AST went from  203 -> 72 -ALT went from 424 -> 239 -Follow-up Repeat Hepatitis Panel shows Hep C Ab >11.0 -Per Gastroenterology not a Candidate for Treatment currently but will need to be referred to Infectious Disease Clinic after D/C   History of Subclinical Hypothyroidism -Previously documented back in 2016. -Checked TSH and was 0.404  History of Polysubstance Abuse including Opioid Abuse  -Previous history of Heroin Abuse -Avoid Narcotics as much as possible and had D/C'd IV Morphine but restarted by General Surgery for Pain control given Surgery. C/w 2 mg q3hprn and try to get off of as soon as possible -General Surgery also started Tramadol 50 mg po q6hprn but increased it to 100 mg po q6hprn -Still current uses  tobacco, and unclear about other drugs. -C/w Nicotine Patch 21 mg TD q24h for Tobacco Abuse  Mood Disorder History -Patient with previous history of PTSD, Anxiety, Depression, other diagnoses -Currently not on any treatment for these at this time but review shows she has been on Citalopram 40 mg po Daily and Oxcarbazepine 300 mg  po BID  Normocytic Anemia -Likely dilutional Drop though Urinalysis did show some blood -Hb/Hct stable at 12.0/37.9 -Continue to Monitor for S/Sx of Bleeding as patient has Hx of Cervical Cancer and Hematuria with Aspirin, and Ibuprofen  -Repeat CBC in AM   Hx of Cervical Cancer -Follow up with Oncology as an outpatient  Hx of Herpes Simplex -Review of Records from [facility] show patient has been on Valacyclovir in the past  Hyperphosphatemia -Patient;s Phos Level was 4.8 -Continue  to Monitor and Repeat Phos Level in AM  DVT prophylaxis: Enoxaparin 40 mg sq q24h Code Status: FULL CODE Family Communication: Discussed with boyfriend at bedside Disposition Plan: Remain Inpatient until Medically Stable to D/C  Consultants:   Gastroenterology  General Surgery    Procedures:  HIDA Scan   Lap Chole 11/06/17   Antimicrobials:  Anti-infectives (From admission, onward)   Start     Dose/Rate Route Frequency Ordered Stop   11/08/17 0930  cefTRIAXone (ROCEPHIN) 1 g in dextrose 5 % 50 mL IVPB     1 g 100 mL/hr over 30 Minutes Intravenous Every 24 hours 11/08/17 0915     11/06/17 0600  cefTRIAXone (ROCEPHIN) 2 g in dextrose 5 % 50 mL IVPB  Status:  Discontinued     2 g 100 mL/hr over 30 Minutes Intravenous On call to O.R. 11/05/17 0826 11/05/17 0900   11/05/17 1000  cefTRIAXone (ROCEPHIN) 2 g in dextrose 5 % 50 mL IVPB  Status:  Discontinued     2 g 100 mL/hr over 30 Minutes Intravenous Every 24 hours 11/05/17 0900 11/06/17 1537     Subjective: Seen and examined at bedside and still having pain. Nausea was improved with Phenergan. No CP but abdomen was still sore along with back.     Objective: Vitals:   11/07/17 2158 11/08/17 0558 11/08/17 1425 11/08/17 2126  BP: 112/70 118/71 (!) 103/8 (!) 98/56  Pulse: (!) 49 (!) 45 (!) 46 (!) 57  Resp: 18 18 18 18   Temp: 98.6 F (37 C) 98.2 F (36.8 C) 98 F (36.7 C) 98.6 F (37 C)  TempSrc: Oral Oral Oral Oral  SpO2: 98% 99% 99% 96%  Weight:      Height:        Intake/Output Summary (Last 24 hours) at 11/08/2017 2153 Last data filed at 11/08/2017 2128 Gross per 24 hour  Intake 1367 ml  Output -  Net 1367 ml   Filed Weights   11/02/17 1822 11/03/17 0035 11/06/17 1245  Weight: 72.6 kg (160 lb) 74.6 kg (164 lb 7.4 oz) 74.6 kg (164 lb 7.4 oz)   Examination: Physical Exam:  Constitutional: WN/WD Caucasian female in mild distress from pain Eyes: Sclerae anicteric. Lids normal ENMT: External ears and nose  appear normal. Poor dentition  Neck: Supple with no JVD Respiratory: Diminished to auscultation. No appreciable wheezing/rales/rhonchi. Patient not tachypenic or using any accessory muscles to breathe Cardiovascular: Appeared bradycardic; S1 S2. No appeciable LE edeam Abdomen: Soft, Tender to palpate. ND. Abdominal Incisions appear C/D/I GU: Deferred Musculoskeletal: No contractures; No cyanosis Skin: Warm and dry. Abdominal Incisions appear C/D/I Neurologic: CN 2-12 grossly intact. No appreciable focal deficits Psychiatric: Normal mood and affect. Intact judgement and insight  Data Reviewed: I have personally reviewed following labs and imaging studies  CBC: Recent Labs  Lab 11/04/17 0758 11/05/17 0527 11/06/17 0605 11/07/17 0453 11/08/17 0630  WBC 4.1 4.6 4.8 8.8 6.0  NEUTROABS 2.2 2.2 2.1 6.7 2.9  HGB 12.1 12.1 12.7 13.9  12.0  HCT 37.1 36.1 38.4 42.3 37.9  MCV 84.3 84.1 84.2 84.9 86.9  PLT 167 166 186 231 237   Basic Metabolic Panel: Recent Labs  Lab 11/04/17 0758 11/05/17 0527 11/06/17 0605 11/07/17 0453 11/08/17 0630  NA 138 137 136 137 138  K 3.8 3.7 3.7 4.1 4.3  CL 107 106 103 101 103  CO2 24 22 26 27 26   GLUCOSE 91 96 93 106* 85  BUN 5* 5* <5* 5* 7  CREATININE 0.62 0.59 0.66 0.78 0.76  CALCIUM 8.6* 8.4* 9.0 9.6 9.0  MG 2.2 2.1 2.2 2.2 2.2  PHOS 3.8 3.9 4.9* 4.6 4.8*   GFR: Estimated Creatinine Clearance: 107.5 mL/min (by C-G formula based on SCr of 0.76 mg/dL). Liver Function Tests: Recent Labs  Lab 11/04/17 0758 11/05/17 0527 11/06/17 0605 11/07/17 0453 11/08/17 0630  AST 203* 186* 181* 108* 72*  ALT 407* 404* 412* 348* 239*  ALKPHOS 111 110 119 127* 110  BILITOT 0.8 0.7 0.6 0.7 0.7  PROT 6.6 6.6 6.8 7.7 5.4*  ALBUMIN 3.5 3.3* 3.6 4.1 3.3*   Recent Labs  Lab 11/02/17 2021 11/06/17 0605  LIPASE 88* 53*   No results for input(s): AMMONIA in the last 168 hours. Coagulation Profile: Recent Labs  Lab 11/02/17 2155  INR 1.35   Cardiac  Enzymes: No results for input(s): CKTOTAL, CKMB, CKMBINDEX, TROPONINI in the last 168 hours. BNP (last 3 results) No results for input(s): PROBNP in the last 8760 hours. HbA1C: No results for input(s): HGBA1C in the last 72 hours. CBG: No results for input(s): GLUCAP in the last 168 hours. Lipid Profile: No results for input(s): CHOL, HDL, LDLCALC, TRIG, CHOLHDL, LDLDIRECT in the last 72 hours. Thyroid Function Tests: No results for input(s): TSH, T4TOTAL, FREET4, T3FREE, THYROIDAB in the last 72 hours. Anemia Panel: No results for input(s): VITAMINB12, FOLATE, FERRITIN, TIBC, IRON, RETICCTPCT in the last 72 hours. Sepsis Labs: No results for input(s): PROCALCITON, LATICACIDVEN in the last 168 hours.  Recent Results (from the past 240 hour(s))  Surgical pcr screen     Status: None   Collection Time: 11/04/17  8:37 PM  Result Value Ref Range Status   MRSA, PCR NEGATIVE NEGATIVE Final   Staphylococcus aureus NEGATIVE NEGATIVE Final    Comment: (NOTE) The Xpert SA Assay (FDA approved for NASAL specimens in patients 66 years of age and older), is one component of a comprehensive surveillance program. It is not intended to diagnose infection nor to guide or monitor treatment.   Culture, Urine     Status: None   Collection Time: 11/06/17 12:10 PM  Result Value Ref Range Status   Specimen Description URINE, CLEAN CATCH  Final   Special Requests NONE  Final   Culture NO GROWTH  Final   Report Status 11/07/2017 FINAL  Final    Radiology Studies: Ct Abdomen Pelvis W Contrast  Result Date: 11/07/2017 CLINICAL DATA:  Acute onset of nausea and vomiting. Status post recent cholecystectomy. EXAM: CT ABDOMEN AND PELVIS WITH CONTRAST TECHNIQUE: Multidetector CT imaging of the abdomen and pelvis was performed using the standard protocol following bolus administration of intravenous contrast. CONTRAST:  133mL ISOVUE-300 IOPAMIDOL (ISOVUE-300) INJECTION 61% COMPARISON:  Right upper quadrant  ultrasound performed 11/02/2017 FINDINGS: Lower chest: Mild bibasilar atelectasis or scarring is noted. The visualized portions of the mediastinum are unremarkable. Hepatobiliary: The liver is unremarkable in appearance. Scattered free air about the liver is thought to reflect recent cholecystectomy. Trace fluid is seen tracking along  the gallbladder fossa. Clips are noted at the gallbladder fossa. The common bile duct remains normal in caliber. Pancreas: The pancreas is within normal limits. Spleen: The spleen is unremarkable in appearance. Adrenals/Urinary Tract: The adrenal glands are unremarkable in appearance. There is suggestion of decreased cortical enhancement about the left kidney and upper pole of the right kidney. Would correlate clinically for evidence of pyelonephritis. There is no evidence of hydronephrosis. No renal or ureteral stones are identified. No perinephric stranding is seen. Stomach/Bowel: Trace fluid tracks along the right paracolic gutter, with a small amount of free fluid seen in the pelvis. This is likely normal status post recent cholecystectomy, though a bile leak cannot be entirely excluded. If the patient's symptoms persist, HIDA scan could be considered to assess for bile leak. The stomach is unremarkable in appearance. The small bowel is within normal limits. The appendix is not visualized; there is no evidence for appendicitis. The colon is unremarkable in appearance. Vascular/Lymphatic: The abdominal aorta is unremarkable in appearance. A retroaortic left renal vein is noted. The inferior vena cava is grossly unremarkable. No retroperitoneal lymphadenopathy is seen. No pelvic sidewall lymphadenopathy is identified. Reproductive: The bladder is decompressed and not well assessed. The uterus is unremarkable in appearance. The ovaries are relatively symmetric. No suspicious adnexal masses are seen. Other: No additional soft tissue abnormalities are seen. Musculoskeletal: No acute  osseous abnormalities are identified. Vacuum phenomenon and endplate sclerotic change are noted at L5-S1. The visualized musculature is unremarkable in appearance. IMPRESSION: 1. Suggestion of decreased cortical enhancement about the left kidney upper pole of the right kidney. Would correlate clinically for evidence of pyelonephritis. 2. Trace fluid about the gallbladder fossa, tracking along the right paracolic gutter, and small amount of free fluid seen in the pelvis. This is likely normal status post recent cholecystectomy, though a bile leak cannot be entirely excluded. If the patient's symptoms persist, HIDA scan could be considered to assess for bile leak. Electronically Signed   By: Garald Balding M.D.   On: 11/07/2017 23:14   Scheduled Meds: . docusate sodium  100 mg Oral BID  . enoxaparin (LOVENOX) injection  40 mg Subcutaneous Daily  . guaiFENesin  1,200 mg Oral BID  . nicotine  21 mg Transdermal Daily   Continuous Infusions: . 0.9 % NaCl with KCl 20 mEq / L 75 mL/hr at 11/08/17 0924  . cefTRIAXone (ROCEPHIN)  IV Stopped (11/08/17 1052)  . lactated ringers 10 mL/hr at 11/07/17 1423    LOS: 4 days   Kerney Elbe, [place] [facility] Pager [phone]  If 7PM-7AM, please contact night-coverage [email or web address] Password [facility] 11/08/2017, 9:53 PM

## 2017-11-09 ENCOUNTER — Inpatient Hospital Stay (HOSPITAL_COMMUNITY): Payer: Medicaid Other

## 2017-11-09 DIAGNOSIS — R1084 Generalized abdominal pain: Secondary | ICD-10-CM

## 2017-11-09 LAB — COMPREHENSIVE METABOLIC PANEL
ALT: 217 U/L — AB (ref 14–54)
AST: 85 U/L — ABNORMAL HIGH (ref 15–41)
Albumin: 3 g/dL — ABNORMAL LOW (ref 3.5–5.0)
Alkaline Phosphatase: 135 U/L — ABNORMAL HIGH (ref 38–126)
Anion gap: 6 (ref 5–15)
BILIRUBIN TOTAL: 0.5 mg/dL (ref 0.3–1.2)
BUN: 10 mg/dL (ref 6–20)
CHLORIDE: 107 mmol/L (ref 101–111)
CO2: 24 mmol/L (ref 22–32)
CREATININE: 0.73 mg/dL (ref 0.44–1.00)
Calcium: 8.4 mg/dL — ABNORMAL LOW (ref 8.9–10.3)
Glucose, Bld: 82 mg/dL (ref 65–99)
POTASSIUM: 4.6 mmol/L (ref 3.5–5.1)
Sodium: 137 mmol/L (ref 135–145)
TOTAL PROTEIN: 6 g/dL — AB (ref 6.5–8.1)

## 2017-11-09 LAB — CBC WITH DIFFERENTIAL/PLATELET
Basophils Absolute: 0.1 10*3/uL (ref 0.0–0.1)
Basophils Relative: 1 %
EOS PCT: 3 %
Eosinophils Absolute: 0.2 10*3/uL (ref 0.0–0.7)
HCT: 35.4 % — ABNORMAL LOW (ref 36.0–46.0)
Hemoglobin: 11.6 g/dL — ABNORMAL LOW (ref 12.0–15.0)
LYMPHS ABS: 2.5 10*3/uL (ref 0.7–4.0)
LYMPHS PCT: 46 %
MCH: 28.6 pg (ref 26.0–34.0)
MCHC: 32.8 g/dL (ref 30.0–36.0)
MCV: 87.2 fL (ref 78.0–100.0)
MONO ABS: 0.3 10*3/uL (ref 0.1–1.0)
Monocytes Relative: 6 %
Neutro Abs: 2.4 10*3/uL (ref 1.7–7.7)
Neutrophils Relative %: 44 %
PLATELETS: 176 10*3/uL (ref 150–400)
RBC: 4.06 MIL/uL (ref 3.87–5.11)
RDW: 16 % — AB (ref 11.5–15.5)
WBC: 5.4 10*3/uL (ref 4.0–10.5)

## 2017-11-09 LAB — MAGNESIUM: MAGNESIUM: 2.1 mg/dL (ref 1.7–2.4)

## 2017-11-09 LAB — PHOSPHORUS: PHOSPHORUS: 4.7 mg/dL — AB (ref 2.5–4.6)

## 2017-11-09 MED ORDER — HYDROCODONE-ACETAMINOPHEN 5-325 MG PO TABS
1.0000 | ORAL_TABLET | Freq: Three times a day (TID) | ORAL | Status: DC | PRN
  Administered 2017-11-09 – 2017-11-11 (×7): 1 via ORAL
  Filled 2017-11-09 (×7): qty 1

## 2017-11-09 MED ORDER — POLYETHYLENE GLYCOL 3350 17 G PO PACK
17.0000 g | PACK | Freq: Two times a day (BID) | ORAL | Status: DC
  Administered 2017-11-10 – 2017-11-11 (×2): 17 g via ORAL
  Filled 2017-11-09 (×4): qty 1

## 2017-11-09 MED ORDER — METHOCARBAMOL 500 MG PO TABS
500.0000 mg | ORAL_TABLET | Freq: Four times a day (QID) | ORAL | Status: DC | PRN
  Administered 2017-11-09 – 2017-11-11 (×7): 500 mg via ORAL
  Filled 2017-11-09 (×7): qty 1

## 2017-11-09 MED ORDER — HYDROCODONE-ACETAMINOPHEN 5-325 MG PO TABS: 1.0000 | ORAL_TABLET | Freq: Four times a day (QID) | ORAL | Status: DC | PRN

## 2017-11-09 MED ORDER — KETOROLAC TROMETHAMINE 30 MG/ML IJ SOLN
30.0000 mg | Freq: Three times a day (TID) | INTRAMUSCULAR | Status: AC | PRN
  Administered 2017-11-09 – 2017-11-10 (×4): 30 mg via INTRAVENOUS
  Filled 2017-11-09 (×4): qty 1

## 2017-11-09 MED ORDER — SODIUM CHLORIDE 0.9 % IV SOLN
INTRAVENOUS | Status: DC
Start: 2017-11-09 — End: 2017-11-11
  Administered 2017-11-09 – 2017-11-10 (×2): via INTRAVENOUS

## 2017-11-09 MED ORDER — SENNOSIDES-DOCUSATE SODIUM 8.6-50 MG PO TABS
1.0000 | ORAL_TABLET | Freq: Two times a day (BID) | ORAL | Status: DC
  Administered 2017-11-09 – 2017-11-11 (×5): 1 via ORAL
  Filled 2017-11-09 (×5): qty 1

## 2017-11-09 MED ORDER — PANTOPRAZOLE SODIUM 40 MG PO TBEC
40.0000 mg | DELAYED_RELEASE_TABLET | Freq: Every day | ORAL | Status: DC
  Administered 2017-11-10 – 2017-11-11 (×2): 40 mg via ORAL
  Filled 2017-11-09 (×3): qty 1

## 2017-11-09 MED ORDER — DICYCLOMINE HCL 10 MG PO CAPS
10.0000 mg | ORAL_CAPSULE | Freq: Three times a day (TID) | ORAL | Status: DC
  Administered 2017-11-09 – 2017-11-11 (×8): 10 mg via ORAL
  Filled 2017-11-09 (×8): qty 1

## 2017-11-09 NOTE — Progress Notes (Signed)
PROGRESS NOTE    Yesenia Bennett  TIW:[id number] DOB: 1986-09-19 DOA: 11/02/2017 PCP: System, Pcp Not In   Brief Narrative:  HPI Per Dr. Elesa Hacker is a 31 y.o. female with medical history significant of Heroin Abuse, hepatitis C, anxiety, depression, PTSD, and cervical cancer diagnosed 2010; resents with complaints of right upper quadrant pain for the last 2 weeks. Describes the pain as a stabbing and cramping pain mostly in the right upper quadrant that radiates to her shoulder. Eating any food seemed to worsen symptoms.  Associated symptoms include complaints of chills, nausea, nonbloody emesis, poor p.o. intake, heartburn, and dizziness.  Patient was previously seen at [facility] on 12/13 with complaints of nausea and vomiting.  Review of records shows that patient was evaluated and seen to have elevated ALT  267, AST 139, and lipase 41.  It appeared she underwent CT scan which showed no acute abnormalities and abdominal ultrasound which showed a small amount of sludge within the gallbladder and mild splenomegaly.  Notes that she has had she just recently completed Heroin rehab at [facility], and was the reason why she did not stay at when previously evaluated.  Currently, not on any treatment for her previous history of mood disorders.  Upon admission into the emergency room patient was seen to be afebrile with vital signs relatively within normal limits.  Labs revealed AST 166, ALT 424, lipase 88, total bilirubin 0.7, and all other labs relatively within normal limits.  Chest x-ray was unremarkable on right upper quadrant ultrasound showed signs of mild gallbladder wall thickening without signs of gallstones.  She received 2 L of normal saline IV fluids.  General surgery was consulted, but recommended GI evaluation first as does not appear patient needs a emergent cholecystectomy at this time.  [facility] physician on call was consulted and recommended adding on ethanol level, acetaminophen,  hepatitis panel, and INR.  TRH called to admit.  Gastroenterology evaluated and recommend against MRCP and recommending HIDA scan and Surgical Consultation. Surgery evaluated and took the patient for Laparoscopic Cholecystectomy 11/06/17. Still complaining of Abdominal Pain and Nauseous so obtained CT Abd/Pelvis to evaluate and showed likely Pyelonephritis. Patient was restarted on IV Abx and IVF and now IVF has been D/C'd and Pain control has been changed from IV to po. Anticipate D/C in next 24-48 hours.   Assessment & Plan:   Principal Problem:   RUQ pain Active Problems:   Elevated LFTs   Elevated lipase   Urinary urgency   Chronic hepatitis C without hepatic coma (HCC)   Subclinical hypothyroidism   Mood disorder in conditions classified elsewhere   Polysubstance abuse (HCC)   Chronic cholecystitis   Hyperphosphatemia  Right Upper Quadrant Abdominal Pain associated with evated Lipase, elevated LFTs in the setting of Chronic Cholecystitis s/p Lap Chole POD 3, slightly improving  -Patient presented with complaints of worsening right upper quadrant abdominal pain with radiation to her shoulder.   -Lipase was 88 on Admission, AST was 166, ALT was 424 -Repeat Lipase was 53; AST and ALT remain elevated but slowly trending  -U/S showed mild signs of gallbladder wall thickening suspected secondary to contraction and no gallstones. Due to the elevation in liver enzymes and lipase question the possibility of a large gallstone.  -Gastroenterology consulted and evaluated and feel like cause of pain is difficult to determine and feel there is no convincing evidence of Acute Cholecystitis and do not feel these are typical for Peptic Ulcer so are not pursuing  further endoscopic procedures.  -Admitted to a MedSurg bed -Continue to Monitor intake and output  -D/C'd IV Morphine; C/w po Tramadol and Hydrocodone with IV Ketorolac, Bentyl and Robaxin as patient has Hx of being Heroin  Addict -Acetaminophen Level was <10, PT-INR was 16.6-1.35, and EtOH level was <10  -Gastroenterology recommended against MRCP and recommend obtaining a HIDA Scan -HIDA showed No evidence of cystic duct obstruction to confirm acute cholecystitis. The gallbladder is visible after IV morphine administration. Nonvisualization of the gallbladder out to 1 hour 20 minutes prior to IV morphine administration most likely indicates chronic cholecystitis.  Mild tracer retention by the liver indicating mild  Hepatocellular disease, which is compatible with the given history of hepatitis-C and elevated liver function tests -General Surgery Consulted for HIDA findings.  -General Surgery started patient on IV Morphine and changed to 2 mg q3hprn, Tramadol 50 mg po q6hprn for moderate pain was increased to 100 mg po q6hprn by General Surgery,  -Prophylactic IV Abx with Ceftriaxone 2 grams q24h restarted given CT Scan findings  -Patient on Regular Diet -C/w Zofran for Nausea; D/C'd Promethazine for Breakthrough   -Patient continued to be nauseous and having Abdominal Pain; CT Scan of Abd/Pelvis showed Trace fluid about the gallbladder fossa, tracking along the right paracolic gutter, and small amount of free fluid seen in the pelvis.This is likely normal status post recent cholecystectomy, though a bile leak cannot be entirely excluded.  -CT Scan reviewed with Urology and she has Pyelo and recommended Abx Treatment for 10 days -Repeat CBC in AM   Urinary Urgency and now Flank Pain 2/2 Left Pyelonephritis -Acute.  Patient reports having urinary urgency and pressure symptoms. -Initial Urinalysis and showed Hazy Appearance, Amber Color, Large Hb, 6-30 RBC, 0-5 WBC and Negative Leukocytes and Nitrites -Repeat Urinalysis and Urine Cx; Repeat U/A  -IVF restarted by General Surgery at [facility] + 20 KCl @ 125 mL/hr -D/C'd Ketorolac 30 mg q6hprn  -CT Abd/Pelvis showed Suggestion of decreased cortical enhancement about the left  kidney upper pole of the right kidney. Would correlate clinically for evidence of pyelonephritis. -Restarted IVF as well as IV Abxl; IVF now D/C'd as patient tolerating food well  -Obtained Blood Cx and was Negative at 1 Day -Checked Renal U/S and showed No evidence of hydronephrosis. Note that evaluation for pyelonephritis is significantly limited on ultrasound. Suggestion of increased vascularity at both kidneys, which could reflect pyelonephritis in the appropriate clinical situation. -Case Reviewed with Dr. Lovena Neighbours who feels she does have a Pyelonephritis and recommended 10 day Abx Course and no Surgical intervention at this time -Unfortunately Urine Cx was while patient was on Abx preoperatively for Lap Chole  Hepatitis C -Records show diagnosed with hepatitis C Genotype  1a  back in 2015-2016, and has not had treatment for this at this time.  -AST went from  203 -> 85 -ALT went from 424 -> 217 -Follow-up Repeat Hepatitis Panel shows Hep C Ab >11.0 -Per Gastroenterology not a Candidate for Treatment currently but will need to be referred to Infectious Disease Clinic after D/C   History of Subclinical Hypothyroidism -Previously documented back in 2016. -Checked TSH and was 0.404  History of Polysubstance Abuse including Opioid Abuse  -Previous history of Heroin Abuse -Avoid Narcotics especially IV as much as possible. Had D/C'd IV Morphine but restarted by General Surgery for Pain control given Surgery. Continued 2 mg q3hprn until today and stopped; Placed on po Hydrocodone-Acetaminophen; Monitor closely  -General Surgery also started Tramadol 50  mg po q6hprn but increased it to 100 mg po q6hprn and will continue for now -Still current uses  tobacco, and unclear about other drugs. -C/w Nicotine Patch 21 mg TD q24h for Tobacco Abuse  Mood Disorder History -Patient with previous history of PTSD, Anxiety, Depression, other diagnoses -Currently not on any treatment for these at this  time but review shows she has been on Citalopram 40 mg po Daily and Oxcarbazepine 300 mg po BID  Normocytic Anemia -Likely dilutional Drop though Urinalysis did show some blood likley from Pyelo -Hb/Hct stable at 11.6/35.4 -Continue to Monitor for S/Sx of Bleeding as patient has Hx of Cervical Cancer and Hematuria with Aspirin, and Ibuprofen  -Repeat CBC in AM   Hx of Cervical Cancer -Follow up with Oncology as an outpatient  Hx of Herpes Simplex -Review of Records from [place] show patient has been on Valacyclovir in the past  Hyperphosphatemia -Patient;s Phos Level was 4.7 -Continue to Monitor and Repeat Phos Level in AM  DVT prophylaxis: Enoxaparin 40 mg sq q24h; Encourage Ambulation Code Status: FULL CODE Family Communication: Discussed with boyfriend at bedside Disposition Plan: Anticipate D/C in next 24-48 hours  Consultants:   Gastroenterology  General Surgery    Procedures:  HIDA Scan   Lap Chole 11/06/17   Antimicrobials:  Anti-infectives (From admission, onward)   Start     Dose/Rate Route Frequency Ordered Stop   11/08/17 0930  cefTRIAXone (ROCEPHIN) 1 g in dextrose 5 % 50 mL IVPB     1 g 100 mL/hr over 30 Minutes Intravenous Every 24 hours 11/08/17 0915     11/06/17 0600  cefTRIAXone (ROCEPHIN) 2 g in dextrose 5 % 50 mL IVPB  Status:  Discontinued     2 g 100 mL/hr over 30 Minutes Intravenous On call to O.R. 11/05/17 0826 11/05/17 0900   11/05/17 1000  cefTRIAXone (ROCEPHIN) 2 g in dextrose 5 % 50 mL IVPB  Status:  Discontinued     2 g 100 mL/hr over 30 Minutes Intravenous Every 24 hours 11/05/17 0900 11/06/17 1537     Subjective: Seen and examined and still complaining of pain but states she slept really well last night. Per nurse she was eating a hamburger and eating ice-cream but still had severe flank and abdominal pain. Patient states she is not as nauseous. Patient understood she was being taken off morphine today.   Objective: Vitals:    11/08/17 0558 11/08/17 1425 11/08/17 2126 11/09/17 0544  BP: 118/71 (!) 103/8 (!) 98/56 110/62  Pulse: (!) 45 (!) 46 (!) 57 (!) 49  Resp: 18 18 18 18   Temp: 98.2 F (36.8 C) 98 F (36.7 C) 98.6 F (37 C) 98.5 F (36.9 C)  TempSrc: Oral Oral Oral Oral  SpO2: 99% 99% 96% 98%  Weight:      Height:        Intake/Output Summary (Last 24 hours) at 11/09/2017 0919 Last data filed at 11/09/2017 0545 Gross per 24 hour  Intake 2198.25 ml  Output -  Net 2198.25 ml   Filed Weights   11/02/17 1822 11/03/17 0035 11/06/17 1245  Weight: 72.6 kg (160 lb) 74.6 kg (164 lb 7.4 oz) 74.6 kg (164 lb 7.4 oz)   Examination: Physical Exam:  Constitutional: WN/WD Caucasian female in NAD Eyes: Sclerae Anicteric. Lids normal ENMT:  External Ears and nose appear normal. Poor dentition.  Neck: Supple with no JVD Respiratory: Diminished slightly but no appreciable wheezing/rales/rhonchi. Patient was not tachypenic or using any accessory  muscles to breathe Cardiovascular: Bradycardic but normal rhythm.. S1 S2; No extremity edema Abdomen: Soft, Tender to palpate. ND. Abdominal Incisions appear C/D/I GU: Deferred Musculoskeletal: No contractures; No cyanosis Skin: Warm and Dry. No rashes or lesions. Abdominal incisions appear C/D/I Neurologic: CN 2-12 grossly intact. No appreciable focal deficits Psychiatric: Normal mood and affect. Intact judgement and insight  Data Reviewed: I have personally reviewed following labs and imaging studies  CBC: Recent Labs  Lab 11/05/17 0527 11/06/17 0605 11/07/17 0453 11/08/17 0630 11/09/17 0433  WBC 4.6 4.8 8.8 6.0 5.4  NEUTROABS 2.2 2.1 6.7 2.9 2.4  HGB 12.1 12.7 13.9 12.0 11.6*  HCT 36.1 38.4 42.3 37.9 35.4*  MCV 84.1 84.2 84.9 86.9 87.2  PLT 166 186 231 181 329   Basic Metabolic Panel: Recent Labs  Lab 11/05/17 0527 11/06/17 0605 11/07/17 0453 11/08/17 0630 11/09/17 0433  NA 137 136 137 138 137  K 3.7 3.7 4.1 4.3 4.6  CL 106 103 101 103 107   CO2 22 26 27 26 24   GLUCOSE 96 93 106* 85 82  BUN 5* <5* 5* 7 10  CREATININE 0.59 0.66 0.78 0.76 0.73  CALCIUM 8.4* 9.0 9.6 9.0 8.4*  MG 2.1 2.2 2.2 2.2 2.1  PHOS 3.9 4.9* 4.6 4.8* 4.7*   GFR: Estimated Creatinine Clearance: 107.5 mL/min (by C-G formula based on SCr of 0.73 mg/dL). Liver Function Tests: Recent Labs  Lab 11/05/17 0527 11/06/17 0605 11/07/17 0453 11/08/17 0630 11/09/17 0433  AST 186* 181* 108* 72* 85*  ALT 404* 412* 348* 239* 217*  ALKPHOS 110 119 127* 110 135*  BILITOT 0.7 0.6 0.7 0.7 0.5  PROT 6.6 6.8 7.7 5.4* 6.0*  ALBUMIN 3.3* 3.6 4.1 3.3* 3.0*   Recent Labs  Lab 11/02/17 2021 11/06/17 0605  LIPASE 88* 53*   No results for input(s): AMMONIA in the last 168 hours. Coagulation Profile: Recent Labs  Lab 11/02/17 2155  INR 1.35   Cardiac Enzymes: No results for input(s): CKTOTAL, CKMB, CKMBINDEX, TROPONINI in the last 168 hours. BNP (last 3 results) No results for input(s): PROBNP in the last 8760 hours. HbA1C: No results for input(s): HGBA1C in the last 72 hours. CBG: No results for input(s): GLUCAP in the last 168 hours. Lipid Profile: No results for input(s): CHOL, HDL, LDLCALC, TRIG, CHOLHDL, LDLDIRECT in the last 72 hours. Thyroid Function Tests: No results for input(s): TSH, T4TOTAL, FREET4, T3FREE, THYROIDAB in the last 72 hours. Anemia Panel: No results for input(s): VITAMINB12, FOLATE, FERRITIN, TIBC, IRON, RETICCTPCT in the last 72 hours. Sepsis Labs: No results for input(s): PROCALCITON, LATICACIDVEN in the last 168 hours.  Recent Results (from the past 240 hour(s))  Surgical pcr screen     Status: None   Collection Time: 11/04/17  8:37 PM  Result Value Ref Range Status   MRSA, PCR NEGATIVE NEGATIVE Final   Staphylococcus aureus NEGATIVE NEGATIVE Final    Comment: (NOTE) The Xpert SA Assay (FDA approved for NASAL specimens in patients 51 years of age and older), is one component of a comprehensive surveillance program. It is  not intended to diagnose infection nor to guide or monitor treatment.   Culture, Urine     Status: None   Collection Time: 11/06/17 12:10 PM  Result Value Ref Range Status   Specimen Description URINE, CLEAN CATCH  Final   Special Requests NONE  Final   Culture NO GROWTH  Final   Report Status 11/07/2017 FINAL  Final    Radiology Studies: Ct  Abdomen Pelvis W Contrast  Result Date: 11/07/2017 CLINICAL DATA:  Acute onset of nausea and vomiting. Status post recent cholecystectomy. EXAM: CT ABDOMEN AND PELVIS WITH CONTRAST TECHNIQUE: Multidetector CT imaging of the abdomen and pelvis was performed using the standard protocol following bolus administration of intravenous contrast. CONTRAST:  131mL ISOVUE-300 IOPAMIDOL (ISOVUE-300) INJECTION 61% COMPARISON:  Right upper quadrant ultrasound performed 11/02/2017 FINDINGS: Lower chest: Mild bibasilar atelectasis or scarring is noted. The visualized portions of the mediastinum are unremarkable. Hepatobiliary: The liver is unremarkable in appearance. Scattered free air about the liver is thought to reflect recent cholecystectomy. Trace fluid is seen tracking along the gallbladder fossa. Clips are noted at the gallbladder fossa. The common bile duct remains normal in caliber. Pancreas: The pancreas is within normal limits. Spleen: The spleen is unremarkable in appearance. Adrenals/Urinary Tract: The adrenal glands are unremarkable in appearance. There is suggestion of decreased cortical enhancement about the left kidney and upper pole of the right kidney. Would correlate clinically for evidence of pyelonephritis. There is no evidence of hydronephrosis. No renal or ureteral stones are identified. No perinephric stranding is seen. Stomach/Bowel: Trace fluid tracks along the right paracolic gutter, with a small amount of free fluid seen in the pelvis. This is likely normal status post recent cholecystectomy, though a bile leak cannot be entirely excluded. If the  patient's symptoms persist, HIDA scan could be considered to assess for bile leak. The stomach is unremarkable in appearance. The small bowel is within normal limits. The appendix is not visualized; there is no evidence for appendicitis. The colon is unremarkable in appearance. Vascular/Lymphatic: The abdominal aorta is unremarkable in appearance. A retroaortic left renal vein is noted. The inferior vena cava is grossly unremarkable. No retroperitoneal lymphadenopathy is seen. No pelvic sidewall lymphadenopathy is identified. Reproductive: The bladder is decompressed and not well assessed. The uterus is unremarkable in appearance. The ovaries are relatively symmetric. No suspicious adnexal masses are seen. Other: No additional soft tissue abnormalities are seen. Musculoskeletal: No acute osseous abnormalities are identified. Vacuum phenomenon and endplate sclerotic change are noted at L5-S1. The visualized musculature is unremarkable in appearance. IMPRESSION: 1. Suggestion of decreased cortical enhancement about the left kidney upper pole of the right kidney. Would correlate clinically for evidence of pyelonephritis. 2. Trace fluid about the gallbladder fossa, tracking along the right paracolic gutter, and small amount of free fluid seen in the pelvis. This is likely normal status post recent cholecystectomy, though a bile leak cannot be entirely excluded. If the patient's symptoms persist, HIDA scan could be considered to assess for bile leak. Electronically Signed   By: Garald Balding M.D.   On: 11/07/2017 23:14   US Renal  Result Date: 11/08/2017 CLINICAL DATA:  Acute onset of bilateral flank pain. Chronic lower back pain. Pyelonephritis suggested on CT. EXAM: RENAL / URINARY TRACT ULTRASOUND COMPLETE COMPARISON:  CT of the abdomen and pelvis performed 11/07/2017 FINDINGS: Right Kidney: Length: 10.7 cm. Echogenicity within normal limits. There is suggestion of increased vascularity. No mass or  hydronephrosis visualized. Left Kidney: Length: 11.0 cm. Echogenicity within normal limits. There is suggestion of increased vascularity. No mass or hydronephrosis visualized. Bladder: Appears normal for degree of bladder distention. Bilateral ureteral jets are visualized. IMPRESSION: No evidence of hydronephrosis. Note that evaluation for pyelonephritis is significantly limited on ultrasound. Suggestion of increased vascularity at both kidneys, which could reflect pyelonephritis in the appropriate clinical situation. Electronically Signed   By: Garald Balding M.D.   On: 11/08/2017 23:05  Scheduled Meds: . docusate sodium  100 mg Oral BID  . enoxaparin (LOVENOX) injection  40 mg Subcutaneous Daily  . guaiFENesin  1,200 mg Oral BID  . nicotine  21 mg Transdermal Daily   Continuous Infusions: . 0.9 % NaCl with KCl 20 mEq / L 75 mL/hr at 11/09/17 0020  . cefTRIAXone (ROCEPHIN)  IV Stopped (11/08/17 1052)  . lactated ringers 10 mL/hr at 11/07/17 1423    LOS: 5 days   Kerney Elbe, [place] [facility] Pager [phone]  If 7PM-7AM, please contact night-coverage [email or web address] Password [email or web address] 11/09/2017, 9:19 AM

## 2017-11-10 LAB — CBC WITH DIFFERENTIAL/PLATELET
BASOS ABS: 0 10*3/uL (ref 0.0–0.1)
Basophils Relative: 1 %
Eosinophils Absolute: 0.1 10*3/uL (ref 0.0–0.7)
Eosinophils Relative: 3 %
HCT: 37 % (ref 36.0–46.0)
Hemoglobin: 11.8 g/dL — ABNORMAL LOW (ref 12.0–15.0)
LYMPHS ABS: 2 10*3/uL (ref 0.7–4.0)
Lymphocytes Relative: 45 %
MCH: 27.5 pg (ref 26.0–34.0)
MCHC: 31.9 g/dL (ref 30.0–36.0)
MCV: 86.2 fL (ref 78.0–100.0)
MONO ABS: 0.3 10*3/uL (ref 0.1–1.0)
Monocytes Relative: 7 %
NEUTROS ABS: 2.1 10*3/uL (ref 1.7–7.7)
Neutrophils Relative %: 44 %
PLATELETS: 174 10*3/uL (ref 150–400)
RBC: 4.29 MIL/uL (ref 3.87–5.11)
RDW: 15.5 % (ref 11.5–15.5)
WBC: 4.5 10*3/uL (ref 4.0–10.5)

## 2017-11-10 LAB — COMPREHENSIVE METABOLIC PANEL
ALBUMIN: 3.2 g/dL — AB (ref 3.5–5.0)
ALT: 169 U/L — ABNORMAL HIGH (ref 14–54)
ANION GAP: 7 (ref 5–15)
AST: 53 U/L — AB (ref 15–41)
Alkaline Phosphatase: 128 U/L — ABNORMAL HIGH (ref 38–126)
BUN: 10 mg/dL (ref 6–20)
CHLORIDE: 105 mmol/L (ref 101–111)
CO2: 24 mmol/L (ref 22–32)
Calcium: 8.8 mg/dL — ABNORMAL LOW (ref 8.9–10.3)
Creatinine, Ser: 0.65 mg/dL (ref 0.44–1.00)
GFR calc Af Amer: >60 mL/min (ref >60)
Glucose, Bld: 86 mg/dL (ref 65–99)
POTASSIUM: 4.3 mmol/L (ref 3.5–5.1)
Sodium: 136 mmol/L (ref 135–145)
TOTAL PROTEIN: 6.3 g/dL — AB (ref 6.5–8.1)
Total Bilirubin: 0.6 mg/dL (ref 0.3–1.2)

## 2017-11-10 LAB — MAGNESIUM: MAGNESIUM: 2.1 mg/dL (ref 1.7–2.4)

## 2017-11-10 LAB — PHOSPHORUS: PHOSPHORUS: 4.2 mg/dL (ref 2.5–4.6)

## 2017-11-10 NOTE — Progress Notes (Signed)
PT Cancellation Note  Patient Details Name: Tamantha Saline MRN: [id number] DOB: 1986-09-14   Cancelled Treatment:    Reason Eval/Treat Not Completed: PT screened, no needs identified, will sign off Spoke with RN and mobility tech, and pt has been walking on unit independently. Report no PT needs. Will sign off. If needs change, please reconsult.   Leighton Ruff, PT, DPT  Acute Rehabilitation Services  Pager: [phone]  Rudean Hitt 11/10/2017, 11:44 AM

## 2017-11-10 NOTE — Evaluation (Signed)
Physical Therapy Evaluation Patient Details Name: Yesenia Bennett MRN: [id number] DOB: December 21, 1985 Today's Date: 11/10/2017   History of Present Illness  Pt is a 31 y/o female admitted secondary to RUQ pain. Pt is s/p laparascopic cholecystectomy on 12/27. PMH includes substance abuse, hepatitis C, anxiety/depression, PTSD, cervical cancer, RA, DVT, and fibromyalgia.   Clinical Impression  Pt admitted secondary to problem above with deficits below. Initially screened pt, however, requested to evaluate by MD and pt. Pt independent with mobility PTA. Upon eval, pt very guarded during gait secondary to abdominal pain. Min guard to supervision for mobility. Encouraged to ambulate throughout the day to help ease abdominal discomfort. Feel pt will progress well once pain controlled and will not need follow up PT. Will continue to follow acutely to maximize functional mobility independence and safety.     Follow Up Recommendations No PT follow up    Equipment Recommendations  None recommended by PT    Recommendations for Other Services       Precautions / Restrictions Precautions Precautions: None Restrictions Weight Bearing Restrictions: No      Mobility  Bed Mobility Overal bed mobility: Needs Assistance Bed Mobility: Rolling;Sidelying to Sit Rolling: Supervision Sidelying to sit: Supervision       General bed mobility comments: Supervision for safety. Educated to use log roll technique to decrease stress on abdomen.   Transfers Overall transfer level: Needs assistance Equipment used: None Transfers: Sit to/from Stand Sit to Stand: Supervision         General transfer comment: Supervision for safety. Increased time to perform secondary to pain. Verbal cues for upright posture.   Ambulation/Gait Ambulation/Gait assistance: Min guard;Supervision Ambulation Distance (Feet): 200 Feet Assistive device: None Gait Pattern/deviations: Step-through pattern;Decreased stride  length;Trunk flexed Gait velocity: Decreased  Gait velocity interpretation: Below normal speed for age/gender General Gait Details: Slow, very guarded gait. Overall steady during gait. Presenting with slouched posture secondary to pain. Educated about walking program to help decrease pain. Will need review in next session.   Stairs            Wheelchair Mobility    Modified Rankin (Stroke Patients Only)       Balance Overall balance assessment: Needs assistance Sitting-balance support: No upper extremity supported;Feet supported Sitting balance-Leahy Scale: Good     Standing balance support: No upper extremity supported;During functional activity Standing balance-Leahy Scale: Fair                               Pertinent Vitals/Pain Pain Assessment: 0-10 Pain Score: 8  Pain Location: abdomen  Pain Descriptors / Indicators: Aching;Operative site guarding Pain Intervention(s): Monitored during session;Limited activity within patient's tolerance;Repositioned    Home Living Family/patient expects to be discharged to:: Shelter/Homeless                 Additional Comments: Per notes, CSW gave information about shelters. Spoke with CSW and relayed information about [facility] in [place].     Prior Function Level of Independence: Independent               Hand Dominance        Extremity/Trunk Assessment   Upper Extremity Assessment Upper Extremity Assessment: Overall WFL for tasks assessed    Lower Extremity Assessment Lower Extremity Assessment: Overall WFL for tasks assessed    Cervical / Trunk Assessment Cervical / Trunk Assessment: Other exceptions Cervical / Trunk Exceptions: slouched posture secondary to abdominal pain.  Communication   Communication: No difficulties  Cognition Arousal/Alertness: Awake/alert Behavior During Therapy: WFL for tasks assessed/performed Overall Cognitive Status: Within Functional Limits for tasks  assessed                                        General Comments General comments (skin integrity, edema, etc.): Pt's boyfriend present during session. Notified mobility tech to work with pt to ensure pt is getting up and ambulating throughout the day.     Exercises     Assessment/Plan    PT Assessment Patient needs continued PT services  PT Problem List Decreased activity tolerance;Decreased mobility;Decreased knowledge of precautions;Pain       PT Treatment Interventions Gait training;Functional mobility training;Stair training;Therapeutic activities;Therapeutic exercise;Patient/family education    PT Goals (Current goals can be found in the Care Plan section)  Acute Rehab PT Goals Patient Stated Goal: to decrease pain  PT Goal Formulation: With patient Time For Goal Achievement: 11/24/17 Potential to Achieve Goals: Good    Frequency Min 3X/week   Barriers to discharge Other (comment) pt homeless     Co-evaluation               AM-PAC PT "6 Clicks" Daily Activity  Outcome Measure Difficulty turning over in bed (including adjusting bedclothes, sheets and blankets)?: A Little Difficulty moving from lying on back to sitting on the side of the bed? : A Little Difficulty sitting down on and standing up from a chair with arms (e.g., wheelchair, bedside commode, etc,.)?: A Little Help needed moving to and from a bed to chair (including a wheelchair)?: A Little Help needed walking in hospital room?: A Little Help needed climbing 3-5 steps with a railing? : A Little 6 Click Score: 18    End of Session   Activity Tolerance: Patient tolerated treatment well Patient left: in chair;with call bell/phone within reach;with family/visitor present Nurse Communication: Mobility status PT Visit Diagnosis: Other abnormalities of gait and mobility (R26.89);Pain Pain - part of body: (abdomen )    Time: 5465-0354 PT Time Calculation (min) (ACUTE ONLY): 18  min   Charges:   PT Evaluation $PT Eval Low Complexity: 1 Low     PT G Codes:        Leighton Ruff, PT, DPT  Acute Rehabilitation Services  Pager: [phone]   Rudean Hitt 11/10/2017, 2:15 PM

## 2017-11-10 NOTE — Progress Notes (Signed)
Pt encouraged to get up OOB and ambulate to assist with gas and GI motility, using IS (2000), showered.

## 2017-11-10 NOTE — Progress Notes (Signed)
PROGRESS NOTE    Caisley Baxendale  GUY:[id number] DOB: 1986/02/26 DOA: 11/02/2017 PCP: System, Pcp Not In   Brief Narrative: Yesenia Bennett is a 31 y.o. femalewith medical history significant ofHeroin Abuse, hepatitis C, anxiety, depression, PTSD, and cervical cancer diagnosed 2010;resents with complaints of right upper quadrant pain for the last 2 weeks. Describes the pain as a stabbing and cramping pain mostly in the right upper quadrant that radiates to her shoulder. Eating any food seemedto worsen symptoms. Associated symptoms include complaints of chills, nausea, nonbloody emesis, poor p.o. intake, heartburn, and dizziness. [facility] of [facility].Review of records shows that patient was evaluated and seen to have elevated ALT 267, AST 139, andlipase41. It appeared she underwent CT scan which showed no acute abnormalities and abdominal ultrasound which showed a small amount of sludge within the gallbladder and mild splenomegaly. Notes that she has had she just recently completed Heroin rehab at [place] was the reason why she did not stay at when previously evaluated. Currently,not on any treatment for her previous history of mood disorders.  Upon admission into the emergency room patient was seen to be afebrile with vital signs relatively within normal limits. Labs revealed AST 166, ALT 424, lipase 88, total bilirubin 0.7, and all other labs relatively within normal limits. Chest x-ray was unremarkable on right upper quadrant ultrasound showed signs of mild gallbladder wall thickening without signs of gallstones. She received 2 L of normal saline IV fluids. General surgery was consulted, but recommended GI evaluation first as does not appear patient needs a emergent cholecystectomy at this time. [facility] on call was consulted and recommended adding on ethanol level, acetaminophen, hepatitis panel,  and INR. [facility] called to admit.  Gastroenterology evaluated and recommend against MRCP and recommending HIDA scan and Surgical Consultation. Surgery evaluated and took the patient for Laparoscopic Cholecystectomy 11/06/17. Still complaining of Abdominal Pain and Nauseous so obtained CT Abd/Pelvis to evaluate and showed likely Pyelonephritis. Patient was restarted on IV Abx and IVF and now IVF has been D/C'd and Pain control has been changed from IV to po.   Assessment & Plan:   Principal Problem:   RUQ pain Active Problems:   Elevated LFTs   Elevated lipase   Urinary urgency   Chronic hepatitis C without hepatic coma (HCC)   Subclinical hypothyroidism   Mood disorder in conditions classified elsewhere   Polysubstance abuse (HCC)   Chronic cholecystitis   Hyperphosphatemia    Right Upper Quadrant Abdominal Pain associated with evatedLipase,elevated LFTs in the setting of Chronic Cholecystitis s/p Lap Chole POD 3 Patient improved but still with some abdominal pain and nausea. -Continue Zofran, Norco -General surgery recommendations -Bentyl  Pyelonephritis Urine culture with no growth likely secondary to antibiotics. Symptoms have improved. Afebrile overnight. -Continue ceftriaxone  History of polysubstance abuse History of heroin abuse. Avoiding IV narcotics  Normocytic anemia Secondary to dilution. Stable and slightly increased.  History of cervical cancer Outpatient follow-up  History of herpes simplex No current breakout  Hepatitis C infection Active. Recommend infectious disease follow-up at discharge.   DVT prophylaxis: Lovenox Code Status: Full code Family Communication: Fiance at bedside Disposition Plan: Discharge home likely in AM after transition to oral antibiotics   Consultants:   GI  General surgery  Procedures:   HIDA scan  Lap chole (11/06/2017)  Antimicrobials:  Ceftriaxone    Subjective: Abdominal pain. Low grade fever.    Objective: Vitals:   11/09/17 1845 11/09/17  2128 11/10/17 0610 11/10/17 1346  BP: 124/80 120/78 123/82 117/66  Pulse: (!) 55 (!) 56 (!) 46 65  Resp: 18 18 18 18   Temp: 98.8 F (37.1 C) 98.5 F (36.9 C) 98.1 F (36.7 C) 98.9 F (37.2 C)  TempSrc: Oral Oral Oral Oral  SpO2: 99% 98% 99% 99%  Weight:      Height:        Intake/Output Summary (Last 24 hours) at 11/10/2017 1813 Last data filed at 11/10/2017 1740 Gross per 24 hour  Intake 1860 ml  Output 400 ml  Net 1460 ml   Filed Weights   11/02/17 1822 11/03/17 0035 11/06/17 1245  Weight: 72.6 kg (160 lb) 74.6 kg (164 lb 7.4 oz) 74.6 kg (164 lb 7.4 oz)    Examination:  General exam: Appears calm and comfortable Respiratory system: Clear to auscultation. Respiratory effort normal. Cardiovascular system: S1 & S2 heard, RRR. No murmurs, rubs, gallops or clicks. Gastrointestinal system: Abdomen is nondistended, soft and mildly tender in periumbilical region. No organomegaly or masses felt. Normal bowel sounds heard. Central nervous system: Alert and oriented. No focal neurological deficits. Extremities: No edema. No calf tenderness Skin: No cyanosis. No rashes Psychiatry: Judgement and insight appear normal. Mood & affect appropriate.     Data Reviewed: I have personally reviewed following labs and imaging studies  CBC: Recent Labs  Lab 11/06/17 0605 11/07/17 0453 11/08/17 0630 11/09/17 0433 11/10/17 0739  WBC 4.8 8.8 6.0 5.4 4.5  NEUTROABS 2.1 6.7 2.9 2.4 2.1  HGB 12.7 13.9 12.0 11.6* 11.8*  HCT 38.4 42.3 37.9 35.4* 37.0  MCV 84.2 84.9 86.9 87.2 86.2  PLT 186 231 181 176 009   Basic Metabolic Panel: Recent Labs  Lab 11/06/17 0605 11/07/17 0453 11/08/17 0630 11/09/17 0433 11/10/17 0739  NA 136 137 138 137 136  K 3.7 4.1 4.3 4.6 4.3  CL 103 101 103 107 105  CO2 26 27 26 24 24   GLUCOSE 93 106* 85 82 86  BUN <5* 5* 7 10 10   CREATININE 0.66 0.78 0.76 0.73 0.65  CALCIUM 9.0 9.6 9.0 8.4* 8.8*  MG 2.2  2.2 2.2 2.1 2.1  PHOS 4.9* 4.6 4.8* 4.7* 4.2   GFR: Estimated Creatinine Clearance: 107.5 mL/min (by C-G formula based on SCr of 0.65 mg/dL). Liver Function Tests: Recent Labs  Lab 11/06/17 0605 11/07/17 0453 11/08/17 0630 11/09/17 0433 11/10/17 0739  AST 181* 108* 72* 85* 53*  ALT 412* 348* 239* 217* 169*  ALKPHOS 119 127* 110 135* 128*  BILITOT 0.6 0.7 0.7 0.5 0.6  PROT 6.8 7.7 5.4* 6.0* 6.3*  ALBUMIN 3.6 4.1 3.3* 3.0* 3.2*   Recent Labs  Lab 11/06/17 0605  LIPASE 53*   No results for input(s): AMMONIA in the last 168 hours. Coagulation Profile: No results for input(s): INR, PROTIME in the last 168 hours. Cardiac Enzymes: No results for input(s): CKTOTAL, CKMB, CKMBINDEX, TROPONINI in the last 168 hours. BNP (last 3 results) No results for input(s): PROBNP in the last 8760 hours. HbA1C: No results for input(s): HGBA1C in the last 72 hours. CBG: No results for input(s): GLUCAP in the last 168 hours. Lipid Profile: No results for input(s): CHOL, HDL, LDLCALC, TRIG, CHOLHDL, LDLDIRECT in the last 72 hours. Thyroid Function Tests: No results for input(s): TSH, T4TOTAL, FREET4, T3FREE, THYROIDAB in the last 72 hours. Anemia Panel: No results for input(s): VITAMINB12, FOLATE, FERRITIN, TIBC, IRON, RETICCTPCT in the last 72 hours. Sepsis Labs: No results for input(s): PROCALCITON, LATICACIDVEN  in the last 168 hours.  Recent Results (from the past 240 hour(s))  Surgical pcr screen     Status: None   Collection Time: 11/04/17  8:37 PM  Result Value Ref Range Status   MRSA, PCR NEGATIVE NEGATIVE Final   Staphylococcus aureus NEGATIVE NEGATIVE Final    Comment: (NOTE) The Xpert SA Assay (FDA approved for NASAL specimens in patients 57 years of age and older), is one component of a comprehensive surveillance program. It is not intended to diagnose infection nor to guide or monitor treatment.   Culture, Urine     Status: None   Collection Time: 11/06/17 12:10 PM   Result Value Ref Range Status   Specimen Description URINE, CLEAN CATCH  Final   Special Requests NONE  Final   Culture NO GROWTH  Final   Report Status 11/07/2017 FINAL  Final  Culture, blood (routine x 2)     Status: None (Preliminary result)   Collection Time: 11/08/17 12:00 PM  Result Value Ref Range Status   Specimen Description BLOOD LEFT HAND  Final   Special Requests IN PEDIATRIC BOTTLE Blood Culture adequate volume  Final   Culture NO GROWTH 2 DAYS  Final   Report Status PENDING  Incomplete  Culture, blood (routine x 2)     Status: None (Preliminary result)   Collection Time: 11/08/17 12:35 PM  Result Value Ref Range Status   Specimen Description BLOOD RIGHT HAND  Final   Special Requests IN PEDIATRIC BOTTLE Blood Culture adequate volume  Final   Culture NO GROWTH 2 DAYS  Final   Report Status PENDING  Incomplete         Radiology Studies: Dg Abd 1 View  Result Date: 11/09/2017 CLINICAL DATA:  Abdominal pain EXAM: ABDOMEN - 1 VIEW COMPARISON:  11/07/2017 FINDINGS: Scattered large and small bowel gas is noted. Fecal material is noted within the left colon without obstructive change. No free air is noted. No abnormal mass or abnormal calcifications are seen. IMPRESSION: No acute abnormality noted. Electronically Signed   By: Inez Catalina M.D.   On: 11/09/2017 11:59   US Renal  Result Date: 11/08/2017 CLINICAL DATA:  Acute onset of bilateral flank pain. Chronic lower back pain. Pyelonephritis suggested on CT. EXAM: RENAL / URINARY TRACT ULTRASOUND COMPLETE COMPARISON:  CT of the abdomen and pelvis performed 11/07/2017 FINDINGS: Right Kidney: Length: 10.7 cm. Echogenicity within normal limits. There is suggestion of increased vascularity. No mass or hydronephrosis visualized. Left Kidney: Length: 11.0 cm. Echogenicity within normal limits. There is suggestion of increased vascularity. No mass or hydronephrosis visualized. Bladder: Appears normal for degree of bladder  distention. Bilateral ureteral jets are visualized. IMPRESSION: No evidence of hydronephrosis. Note that evaluation for pyelonephritis is significantly limited on ultrasound. Suggestion of increased vascularity at both kidneys, which could reflect pyelonephritis in the appropriate clinical situation. Electronically Signed   By: Garald Balding M.D.   On: 11/08/2017 23:05        Scheduled Meds: . dicyclomine  10 mg Oral TID AC & HS  . enoxaparin (LOVENOX) injection  40 mg Subcutaneous Daily  . guaiFENesin  1,200 mg Oral BID  . nicotine  21 mg Transdermal Daily  . pantoprazole  40 mg Oral Daily  . polyethylene glycol  17 g Oral BID  . senna-docusate  1 tablet Oral BID   Continuous Infusions: . sodium chloride 50 mL/hr at 11/10/17 0600  . cefTRIAXone (ROCEPHIN)  IV Stopped (11/10/17 0917)  . lactated ringers 10  mL/hr at 11/07/17 1423     LOS: 6 days     Cordelia Poche, MD [facility] 11/10/2017, 6:13 PM Pager: [phone]  If 7PM-7AM, please contact night-coverage [email or web address] Password [facility] 11/10/2017, 6:13 PM

## 2017-11-11 ENCOUNTER — Inpatient Hospital Stay (HOSPITAL_COMMUNITY): Payer: Medicaid Other

## 2017-11-11 ENCOUNTER — Other Ambulatory Visit (HOSPITAL_COMMUNITY): Payer: Medicaid Other

## 2017-11-11 LAB — TROPONIN I

## 2017-11-11 MED ORDER — CEFPODOXIME PROXETIL 200 MG PO TABS: 200.0000 mg | ORAL_TABLET | Freq: Two times a day (BID) | ORAL | 0 refills | Status: AC

## 2017-11-11 MED ORDER — ONDANSETRON 4 MG PO TBDP
4.0000 mg | ORAL_TABLET | Freq: Four times a day (QID) | ORAL | 0 refills | Status: DC | PRN
Start: 2017-11-11 — End: 2017-11-13

## 2017-11-11 MED ORDER — POLYETHYLENE GLYCOL 3350 17 G PO PACK: 17.0000 g | PACK | Freq: Every day | ORAL | 0 refills | Status: DC

## 2017-11-11 MED ORDER — BISACODYL 10 MG RE SUPP
10.0000 mg | Freq: Once | RECTAL | Status: AC
  Administered 2017-11-11: 10 mg via RECTAL
  Filled 2017-11-11: qty 1

## 2017-11-11 NOTE — Discharge Summary (Signed)
Physician Discharge Summary  Yesenia Bennett QQI:[id number] DOB: 11-19-1985 DOA: 11/02/2017  PCP: Kathrine Haddock  Admit date: 11/02/2017 Discharge date: 11/11/2017  Admitted From: Home Disposition: Home  Recommendations for Outpatient Follow-up:  1. Follow up with PCP in 1 week 2. Follow up with General surgery and infectious disease 3. Please obtain BMP/CBC in one week 4. Please follow up on the following pending results: None  Home Health: None Equipment/Devices: None  Discharge Condition: Stable CODE STATUS: Full code Diet recommendation: Regular diet   Brief/Interim Summary:  Admission HPI written by Norval Morton, MD   HPI: Yesenia Bennett is a 32 y.o. female with medical history significant of heroin abuse, hepatitis C, anxiety, depression, PTSD, and cervical cancer diagnosed 2010; resents with complaints of right upper quadrant pain for the last 2 weeks.  Describes the pain as a stabbing and cramping pain mostly in the right upper quadrant that radiates to her shoulder.  Eating any food seemed to worsen symptoms.  Associated symptoms include complaints of chills, nausea, nonbloody emesis, poor p.o. intake, heartburn, and dizziness.  Patient was previously seen at [facility] on 12/13 with complaints of nausea and vomiting.  Review of records shows that patient was evaluated and seen to have elevated ALT  267, AST 139, and lipase 41.  It appears she underwent CT scan which showed no acute abnormalities and abdominal ultrasound which showed a small amount of sludge within the gallbladder and mild splenomegaly.  Notes that she has had she just recently completed heroin rehab at [facility], and was the reason why she did not stay at when previously evaluated.  Currently, not on any treatment for her previous history of mood disorders.  ED Course: Upon admission into the emergency room patient was seen to be afebrile with vital signs relatively within normal limits.  Labs revealed AST  166, ALT 424, lipase 88, total bilirubin 0.7, and all other labs relatively within normal limits.  Chest x-ray was unremarkable on right upper quadrant ultrasound showed signs of mild gallbladder wall thickening without signs of gallstones.  She received 2 L of normal saline IV fluids.  General surgery was consulted, but recommended GI evaluation first as does not appear patient needs a emergent cholecystectomy at this time.  [place] [facility] physician on call was consulted and recommended adding on ethanol level, acetaminophen, hepatitis panel, and INR.  [facility] called to admit.    Hospital course:  Right Upper Quadrant Abdominal Pain associated with evatedLipase,elevated LFTs in the setting of Chronic Cholecystitis s/p Lap Chole POD3 Patient underwent laparoscopic cholecystectomy on 11/06/17. AST, ALT and alkaline phosphatase trended down. Patient managed with analgesics. No narcotics on discharge. Recheck CMP as an outpatient.  Pyelonephritis Urine culture with no growth likely secondary to antibiotics. Symptoms have improved. Ceftriaxone for treatment and transitioned to Vantin.  History of polysubstance abuse History of heroin abuse. Avoided IV narcotics and no narcotics on discharge. Social work consulted.  Normocytic anemia Secondary to dilution. Stable and slightly increased.  History of cervical cancer Outpatient follow-up.  History of herpes simplex No current breakout.  Hepatitis C infection Active. Recommend infectious disease follow-up at discharge.  Chest pain Atypical. EKG unremarkable. Troponin negative. Recommend outpatient follow-up.    Discharge Diagnoses:  Principal Problem:   RUQ pain Active Problems:   Elevated LFTs   Elevated lipase   Urinary urgency   Chronic hepatitis C without hepatic coma (HCC)   Subclinical hypothyroidism   Mood disorder in conditions classified elsewhere   Polysubstance  abuse [facility])   Chronic cholecystitis    Hyperphosphatemia    Discharge Instructions  Discharge Instructions    Diet - low sodium heart healthy   Complete by:  As directed    Increase activity slowly   Complete by:  As directed      Allergies as of 11/11/2017      Reactions   Citric Acid Anaphylaxis   fruit   Adhesive [tape] Other (See Comments)   Blisters    Erythromycin Swelling, Rash      Medication List    TAKE these medications   cefpodoxime 200 MG tablet Commonly known as:  VANTIN Take 1 tablet (200 mg total) by mouth 2 (two) times daily for 3 days. Start taking on:  11/12/2017   ondansetron 4 MG disintegrating tablet Commonly known as:  ZOFRAN-ODT Take 1 tablet (4 mg total) by mouth every 6 (six) hours as needed for nausea.   polyethylene glycol packet Commonly known as:  MIRALAX / GLYCOLAX Take 17 g by mouth daily.      Follow-up Information    [facility] [place] Follow up on 11/20/2017.   Specialty:  General Surgery Why:  Your appointment is at 2:30PM.  Bring photo ID and insurance information.   Be at the office 30 minutes early for check in. Contact information: [address] [place] [address] [phone]        Hyman Hopes H. Schedule an appointment as soon as possible for a visit in 1 week(s).   Specialty:  Family Medicine Why:  Metabolic panel Contact information: [address] [place] [address] [place] [facility]             . Schedule an appointment as soon as possible for a visit in 2 week(s).   Why:  Hepatitis C Contact information: [place] [phone]         Allergies  Allergen Reactions  . Citric Acid Anaphylaxis    fruit  . Adhesive [Tape] Other (See Comments)    Blisters   . Erythromycin Swelling and Rash    Consultations:  General surgery  Urology   Procedures/Studies: Dg Chest 2 View  Result Date: 11/02/2017 CLINICAL DATA:   32 year old with cough and shortness of breath. EXAM: CHEST  2 VIEW COMPARISON:  None. FINDINGS: The heart size and mediastinal contours are within normal limits. Both lungs are clear. The visualized skeletal structures are unremarkable. IMPRESSION: No active cardiopulmonary disease. Electronically Signed   By: Markus Daft M.D.   On: 11/02/2017 20:43   Dg Abd 1 View  Result Date: 11/11/2017 CLINICAL DATA:  32 year old female with a history of abdominal pain 6 days post gallbladder surgery. EXAM: ABDOMEN - 1 VIEW COMPARISON:  11/09/2017 FINDINGS: Gas within stomach and small bowel without abnormal distention. Formed stool throughout the length of the colon with large stool burden, increased from the comparison, particularly of transverse colon. No abnormal distention. No radiopaque foreign body. No unexpected soft tissue density or calcification. Surgical changes of cholecystectomy. IMPRESSION: Nonobstructive bowel gas pattern with large formed stool burden, likely representing constipation given the history. Electronically Signed   By: Corrie Mckusick D.O.   On: 11/11/2017 11:42   Dg Abd 1 View  Result Date: 11/09/2017 CLINICAL DATA:  Abdominal pain EXAM: ABDOMEN - 1 VIEW COMPARISON:  11/07/2017 FINDINGS: Scattered large and small bowel gas is noted. Fecal material is  noted within the left colon without obstructive change. No free air is noted. No abnormal mass or abnormal calcifications are seen. IMPRESSION: No acute abnormality noted. Electronically Signed   By: Inez Catalina M.D.   On: 11/09/2017 11:59   Nm Hepatobiliary Liver Func  Result Date: 11/03/2017 CLINICAL DATA:  32 year old with current history of hepatitis-C, admitted yesterday for a 10 day history of right upper quadrant abdominal pain. Elevated LFTs. Right upper quadrant abdominal ultrasound yesterday demonstrated a contracted gallbladder without evidence of cholelithiasis. EXAM: NUCLEAR MEDICINE HEPATOBILIARY IMAGING TECHNIQUE: Sequential  images of the abdomen were obtained out to 100 minutes following intravenous administration of radiopharmaceutical. At that point, 3 mg of morphine was administered intravenously and imaging was performed for another 30 minutes. RADIOPHARMACEUTICALS:  4.61 mCi Tc-46m  Choletec IV. COMPARISON:  No prior nuclear imaging. Right upper quadrant abdominal ultrasound performed yesterday is correlated. FINDINGS: Hepatic uptake is normal. Intrahepatic bile ducts are visible within 15 minutes. The common hepatic duct and common bile duct are visible within 20 minutes. Small bowel activity is visible within 20 minutes. Gallbladder activity is not visible out to 100 minutes. There is mild tracer retention by the liver. After administration of intravenous morphine, gallbladder activity is visible. IMPRESSION: 1. No evidence of cystic duct obstruction to confirm acute cholecystitis. The gallbladder is visible after IV morphine administration. 2. Nonvisualization of the gallbladder out to 1 hour 20 minutes prior to IV morphine administration most likely indicates chronic cholecystitis. 3. Mild tracer retention by the liver indicating mild hepatocellular disease, which is compatible with the given history of hepatitis-C and elevated liver function tests. Electronically Signed   By: Evangeline Dakin M.D.   On: 11/03/2017 19:48   Ct Abdomen Pelvis W Contrast  Result Date: 11/07/2017 CLINICAL DATA:  Acute onset of nausea and vomiting. Status post recent cholecystectomy. EXAM: CT ABDOMEN AND PELVIS WITH CONTRAST TECHNIQUE: Multidetector CT imaging of the abdomen and pelvis was performed using the standard protocol following bolus administration of intravenous contrast. CONTRAST:  177mL ISOVUE-300 IOPAMIDOL (ISOVUE-300) INJECTION 61% COMPARISON:  Right upper quadrant ultrasound performed 11/02/2017 FINDINGS: Lower chest: Mild bibasilar atelectasis or scarring is noted. The visualized portions of the mediastinum are unremarkable.  Hepatobiliary: The liver is unremarkable in appearance. Scattered free air about the liver is thought to reflect recent cholecystectomy. Trace fluid is seen tracking along the gallbladder fossa. Clips are noted at the gallbladder fossa. The common bile duct remains normal in caliber. Pancreas: The pancreas is within normal limits. Spleen: The spleen is unremarkable in appearance. Adrenals/Urinary Tract: The adrenal glands are unremarkable in appearance. There is suggestion of decreased cortical enhancement about the left kidney and upper pole of the right kidney. Would correlate clinically for evidence of pyelonephritis. There is no evidence of hydronephrosis. No renal or ureteral stones are identified. No perinephric stranding is seen. Stomach/Bowel: Trace fluid tracks along the right paracolic gutter, with a small amount of free fluid seen in the pelvis. This is likely normal status post recent cholecystectomy, though a bile leak cannot be entirely excluded. If the patient's symptoms persist, HIDA scan could be considered to assess for bile leak. The stomach is unremarkable in appearance. The small bowel is within normal limits. The appendix is not visualized; there is no evidence for appendicitis. The colon is unremarkable in appearance. Vascular/Lymphatic: The abdominal aorta is unremarkable in appearance. A retroaortic left renal vein is noted. The inferior vena cava is grossly unremarkable. No retroperitoneal lymphadenopathy is seen. No pelvic sidewall lymphadenopathy  is identified. Reproductive: The bladder is decompressed and not well assessed. The uterus is unremarkable in appearance. The ovaries are relatively symmetric. No suspicious adnexal masses are seen. Other: No additional soft tissue abnormalities are seen. Musculoskeletal: No acute osseous abnormalities are identified. Vacuum phenomenon and endplate sclerotic change are noted at L5-S1. The visualized musculature is unremarkable in appearance.  IMPRESSION: 1. Suggestion of decreased cortical enhancement about the left kidney upper pole of the right kidney. Would correlate clinically for evidence of pyelonephritis. 2. Trace fluid about the gallbladder fossa, tracking along the right paracolic gutter, and small amount of free fluid seen in the pelvis. This is likely normal status post recent cholecystectomy, though a bile leak cannot be entirely excluded. If the patient's symptoms persist, HIDA scan could be considered to assess for bile leak. Electronically Signed   By: Garald Balding M.D.   On: 11/07/2017 23:14   US Renal  Result Date: 11/08/2017 CLINICAL DATA:  Acute onset of bilateral flank pain. Chronic lower back pain. Pyelonephritis suggested on CT. EXAM: RENAL / URINARY TRACT ULTRASOUND COMPLETE COMPARISON:  CT of the abdomen and pelvis performed 11/07/2017 FINDINGS: Right Kidney: Length: 10.7 cm. Echogenicity within normal limits. There is suggestion of increased vascularity. No mass or hydronephrosis visualized. Left Kidney: Length: 11.0 cm. Echogenicity within normal limits. There is suggestion of increased vascularity. No mass or hydronephrosis visualized. Bladder: Appears normal for degree of bladder distention. Bilateral ureteral jets are visualized. IMPRESSION: No evidence of hydronephrosis. Note that evaluation for pyelonephritis is significantly limited on ultrasound. Suggestion of increased vascularity at both kidneys, which could reflect pyelonephritis in the appropriate clinical situation. Electronically Signed   By: Garald Balding M.D.   On: 11/08/2017 23:05   US Abdomen Limited Ruq  Result Date: 11/02/2017 CLINICAL DATA:  Right upper quadrant pain and vomiting. EXAM: ULTRASOUND ABDOMEN LIMITED RIGHT UPPER QUADRANT COMPARISON:  None. FINDINGS: Gallbladder: Gallbladder is small and contracted. Gallbladder wall is slightly prominent measuring 0.3 cm. Negative for gallstones. Patient does not have a sonographic Murphy sign. Common  bile duct: Diameter: 0.3 cm. Liver: No focal lesion identified. Within normal limits in parenchymal echogenicity. Portal vein is patent on color Doppler imaging with normal direction of blood flow towards the liver. Other:  Large amount of gas in the region of the stomach. IMPRESSION: No acute abnormality. Gallbladder wall is mildly thickened and probably due to the gallbladder contraction. No gallstones. Electronically Signed   By: Markus Daft M.D.   On: 11/02/2017 21:13      Subjective: Patient reports chest pain today. Worse when lying down.  Discharge Exam: Vitals:   11/11/17 0545 11/11/17 1342  BP: 123/76 125/75  Pulse: (!) 48 60  Resp: 18 18  Temp: 98.5 F (36.9 C) 98.5 F (36.9 C)  SpO2: 99% 100%   Vitals:   11/10/17 1346 11/10/17 2213 11/11/17 0545 11/11/17 1342  BP: 117/66 119/74 123/76 125/75  Pulse: 65 (!) 56 (!) 48 60  Resp: 18 18 18 18   Temp: 98.9 F (37.2 C) 97.8 F (36.6 C) 98.5 F (36.9 C) 98.5 F (36.9 C)  TempSrc: Oral Axillary  Oral  SpO2: 99% 97% 99% 100%  Weight:      Height:        General: Pt is alert, awake, not in acute distress Cardiovascular: RRR, S1/S2 +, no rubs, no gallops Respiratory: CTA bilaterally, no wheezing, no rhonchi Abdominal: Soft, NT, ND, bowel sounds + Extremities: no edema, no cyanosis    The results of  significant diagnostics from this hospitalization (including imaging, microbiology, ancillary and laboratory) are listed below for reference.     Microbiology: Recent Results (from the past 240 hour(s))  Surgical pcr screen     Status: None   Collection Time: 11/04/17  8:37 PM  Result Value Ref Range Status   MRSA, PCR NEGATIVE NEGATIVE Final   Staphylococcus aureus NEGATIVE NEGATIVE Final    Comment: (NOTE) The Xpert SA Assay (FDA approved for NASAL specimens in patients 78 years of age and older), is one component of a comprehensive surveillance program. It is not intended to diagnose infection nor to guide or  monitor treatment.   Culture, Urine     Status: None   Collection Time: 11/06/17 12:10 PM  Result Value Ref Range Status   Specimen Description URINE, CLEAN CATCH  Final   Special Requests NONE  Final   Culture NO GROWTH  Final   Report Status 11/07/2017 FINAL  Final  Culture, blood (routine x 2)     Status: None (Preliminary result)   Collection Time: 11/08/17 12:00 PM  Result Value Ref Range Status   Specimen Description BLOOD LEFT HAND  Final   Special Requests IN PEDIATRIC BOTTLE Blood Culture adequate volume  Final   Culture NO GROWTH 2 DAYS  Final   Report Status PENDING  Incomplete  Culture, blood (routine x 2)     Status: None (Preliminary result)   Collection Time: 11/08/17 12:35 PM  Result Value Ref Range Status   Specimen Description BLOOD RIGHT HAND  Final   Special Requests IN PEDIATRIC BOTTLE Blood Culture adequate volume  Final   Culture NO GROWTH 2 DAYS  Final   Report Status PENDING  Incomplete     Labs: BNP (last 3 results) No results for input(s): BNP in the last 8760 hours. Basic Metabolic Panel: Recent Labs  Lab 11/06/17 0605 11/07/17 0453 11/08/17 0630 11/09/17 0433 11/10/17 0739  NA 136 137 138 137 136  K 3.7 4.1 4.3 4.6 4.3  CL 103 101 103 107 105  CO2 26 27 26 24 24   GLUCOSE 93 106* 85 82 86  BUN <5* 5* 7 10 10   CREATININE 0.66 0.78 0.76 0.73 0.65  CALCIUM 9.0 9.6 9.0 8.4* 8.8*  MG 2.2 2.2 2.2 2.1 2.1  PHOS 4.9* 4.6 4.8* 4.7* 4.2   Liver Function Tests: Recent Labs  Lab 11/06/17 0605 11/07/17 0453 11/08/17 0630 11/09/17 0433 11/10/17 0739  AST 181* 108* 72* 85* 53*  ALT 412* 348* 239* 217* 169*  ALKPHOS 119 127* 110 135* 128*  BILITOT 0.6 0.7 0.7 0.5 0.6  PROT 6.8 7.7 5.4* 6.0* 6.3*  ALBUMIN 3.6 4.1 3.3* 3.0* 3.2*   Recent Labs  Lab 11/06/17 0605  LIPASE 53*   No results for input(s): AMMONIA in the last 168 hours. CBC: Recent Labs  Lab 11/06/17 0605 11/07/17 0453 11/08/17 0630 11/09/17 0433 11/10/17 0739  WBC 4.8  8.8 6.0 5.4 4.5  NEUTROABS 2.1 6.7 2.9 2.4 2.1  HGB 12.7 13.9 12.0 11.6* 11.8*  HCT 38.4 42.3 37.9 35.4* 37.0  MCV 84.2 84.9 86.9 87.2 86.2  PLT 186 231 181 176 174   Cardiac Enzymes: Recent Labs  Lab 11/11/17 1047  TROPONINI <0.03   Urinalysis    Component Value Date/Time   COLORURINE STRAW (A) 11/06/2017 1210   APPEARANCEUR CLEAR 11/06/2017 1210   LABSPEC 1.005 11/06/2017 1210   PHURINE 8.0 11/06/2017 1210   GLUCOSEU NEGATIVE 11/06/2017 1210   HGBUR LARGE (A)  11/06/2017 1210   BILIRUBINUR NEGATIVE 11/06/2017 1210   KETONESUR NEGATIVE 11/06/2017 1210   PROTEINUR NEGATIVE 11/06/2017 1210   NITRITE NEGATIVE 11/06/2017 1210   LEUKOCYTESUR NEGATIVE 11/06/2017 1210   Sepsis Labs Invalid input(s): PROCALCITONIN,  WBC,  LACTICIDVEN Microbiology Recent Results (from the past 240 hour(s))  Surgical pcr screen     Status: None   Collection Time: 11/04/17  8:37 PM  Result Value Ref Range Status   MRSA, PCR NEGATIVE NEGATIVE Final   Staphylococcus aureus NEGATIVE NEGATIVE Final    Comment: (NOTE) The Xpert SA Assay (FDA approved for NASAL specimens in patients 26 years of age and older), is one component of a comprehensive surveillance program. It is not intended to diagnose infection nor to guide or monitor treatment.   Culture, Urine     Status: None   Collection Time: 11/06/17 12:10 PM  Result Value Ref Range Status   Specimen Description URINE, CLEAN CATCH  Final   Special Requests NONE  Final   Culture NO GROWTH  Final   Report Status 11/07/2017 FINAL  Final  Culture, blood (routine x 2)     Status: None (Preliminary result)   Collection Time: 11/08/17 12:00 PM  Result Value Ref Range Status   Specimen Description BLOOD LEFT HAND  Final   Special Requests IN PEDIATRIC BOTTLE Blood Culture adequate volume  Final   Culture NO GROWTH 2 DAYS  Final   Report Status PENDING  Incomplete  Culture, blood (routine x 2)     Status: None (Preliminary result)   Collection Time:  11/08/17 12:35 PM  Result Value Ref Range Status   Specimen Description BLOOD RIGHT HAND  Final   Special Requests IN PEDIATRIC BOTTLE Blood Culture adequate volume  Final   Culture NO GROWTH 2 DAYS  Final   Report Status PENDING  Incomplete     SIGNED:   Cordelia Poche, MD [facility] 11/11/2017, 1:45 PM Pager [phone]  If 7PM-7AM, please contact night-coverage [email or web address] Password [email or web address]

## 2017-11-11 NOTE — Progress Notes (Signed)
Pt ready for discharge to friends house in [place].  Pt has [employer] so cannot provide [place] letter.  Pt instructed on s/s of infection.  All follow up appointments reviewed with pt.  Rx's given and explained.  Pt instructed to continue to ambulate and to use IS at home.  All abd. Incisional sites look good and without any s/s of infection.  No nausea or vomiting.  Pt had very large BM today.  No further questions about home self care.

## 2017-11-11 NOTE — Progress Notes (Signed)
Pt had a large amt of BM. Pt's boyfriend contacted a friend in [place] to come and get them and they can stay there post discharge.

## 2017-11-11 NOTE — Progress Notes (Signed)
Operator paging person who does [employer] today to see if they are here, to call me.

## 2017-11-12 ENCOUNTER — Encounter (HOSPITAL_COMMUNITY): Payer: Self-pay

## 2017-11-12 ENCOUNTER — Other Ambulatory Visit: Payer: Self-pay

## 2017-11-12 DIAGNOSIS — R1033 Periumbilical pain: Secondary | ICD-10-CM | POA: Diagnosis present

## 2017-11-12 DIAGNOSIS — G8918 Other acute postprocedural pain: Secondary | ICD-10-CM | POA: Insufficient documentation

## 2017-11-12 DIAGNOSIS — F17210 Nicotine dependence, cigarettes, uncomplicated: Secondary | ICD-10-CM | POA: Insufficient documentation

## 2017-11-12 LAB — URINALYSIS, ROUTINE W REFLEX MICROSCOPIC
BILIRUBIN URINE: NEGATIVE
GLUCOSE, UA: NEGATIVE mg/dL
HGB URINE DIPSTICK: NEGATIVE
KETONES UR: NEGATIVE mg/dL
NITRITE: NEGATIVE
PH: 7 (ref 5.0–8.0)
PROTEIN: 30 mg/dL — AB
Specific Gravity, Urine: 1.023 (ref 1.005–1.030)

## 2017-11-12 LAB — I-STAT CG4 LACTIC ACID, ED: LACTIC ACID, VENOUS: 1.98 mmol/L — AB (ref 0.5–1.9)

## 2017-11-12 LAB — COMPREHENSIVE METABOLIC PANEL
ALT: 145 U/L — ABNORMAL HIGH (ref 14–54)
AST: 63 U/L — ABNORMAL HIGH (ref 15–41)
Albumin: 4.1 g/dL (ref 3.5–5.0)
Alkaline Phosphatase: 134 U/L — ABNORMAL HIGH (ref 38–126)
Anion gap: 9 (ref 5–15)
BILIRUBIN TOTAL: 0.5 mg/dL (ref 0.3–1.2)
BUN: 14 mg/dL (ref 6–20)
CO2: 26 mmol/L (ref 22–32)
CREATININE: 0.73 mg/dL (ref 0.44–1.00)
Calcium: 9.6 mg/dL (ref 8.9–10.3)
Chloride: 106 mmol/L (ref 101–111)
Glucose, Bld: 110 mg/dL — ABNORMAL HIGH (ref 65–99)
POTASSIUM: 3.7 mmol/L (ref 3.5–5.1)
Sodium: 141 mmol/L (ref 135–145)
TOTAL PROTEIN: 7.9 g/dL (ref 6.5–8.1)

## 2017-11-12 LAB — CBC WITH DIFFERENTIAL/PLATELET
BASOS ABS: 0.1 10*3/uL (ref 0.0–0.1)
Basophils Relative: 1 %
EOS ABS: 0 10*3/uL (ref 0.0–0.7)
Eosinophils Relative: 0 %
HCT: 43.1 % (ref 36.0–46.0)
HEMOGLOBIN: 14.1 g/dL (ref 12.0–15.0)
Lymphocytes Relative: 24 %
Lymphs Abs: 2 10*3/uL (ref 0.7–4.0)
MCH: 28.1 pg (ref 26.0–34.0)
MCHC: 32.7 g/dL (ref 30.0–36.0)
MCV: 86 fL (ref 78.0–100.0)
Monocytes Absolute: 0.5 10*3/uL (ref 0.1–1.0)
Monocytes Relative: 6 %
NEUTROS PCT: 69 %
Neutro Abs: 5.8 10*3/uL (ref 1.7–7.7)
PLATELETS: 256 10*3/uL (ref 150–400)
RBC: 5.01 MIL/uL (ref 3.87–5.11)
RDW: 15.7 % — ABNORMAL HIGH (ref 11.5–15.5)
WBC: 8.3 10*3/uL (ref 4.0–10.5)

## 2017-11-12 LAB — I-STAT BETA HCG BLOOD, ED (MC, WL, AP ONLY): I-stat hCG, quantitative: <5 m[IU]/mL (ref <5)

## 2017-11-12 NOTE — ED Triage Notes (Signed)
Patient reports that she was just discharged from hospital following gallbladder surgery and today developed fever and increased bruising around surgical puncture sites. Patient alert and oriented NAD

## 2017-11-13 ENCOUNTER — Emergency Department (HOSPITAL_COMMUNITY): Payer: Medicaid Other

## 2017-11-13 ENCOUNTER — Emergency Department (HOSPITAL_COMMUNITY)
Admission: EM | Admit: 2017-11-13 | Discharge: 2017-11-13 | Disposition: A | Discharge status: Home / Self Care | Payer: Medicaid Other | Attending: Emergency Medicine | Admitting: Emergency Medicine

## 2017-11-13 DIAGNOSIS — G8918 Other acute postprocedural pain: Secondary | ICD-10-CM

## 2017-11-13 LAB — CULTURE, BLOOD (ROUTINE X 2)
CULTURE: NO GROWTH
Culture: NO GROWTH
SPECIAL REQUESTS: ADEQUATE
Special Requests: ADEQUATE

## 2017-11-13 LAB — LIPASE, BLOOD: Lipase: 60 U/L — ABNORMAL HIGH (ref 11–51)

## 2017-11-13 MED ORDER — ONDANSETRON HCL 4 MG/2ML IJ SOLN
4.0000 mg | Freq: Once | INTRAMUSCULAR | Status: AC
  Administered 2017-11-13: 4 mg via INTRAVENOUS
  Filled 2017-11-13: qty 2

## 2017-11-13 MED ORDER — IOPAMIDOL (ISOVUE-300) INJECTION 61%
INTRAVENOUS | Status: AC
  Filled 2017-11-13: qty 30

## 2017-11-13 MED ORDER — IOPAMIDOL (ISOVUE-300) INJECTION 61%
INTRAVENOUS | Status: AC
  Administered 2017-11-13: 100 mL
  Filled 2017-11-13: qty 100

## 2017-11-13 MED ORDER — DICYCLOMINE HCL 10 MG/ML IM SOLN
20.0000 mg | Freq: Once | INTRAMUSCULAR | Status: AC
  Administered 2017-11-13: 20 mg via INTRAMUSCULAR
  Filled 2017-11-13: qty 2

## 2017-11-13 MED ORDER — DICYCLOMINE HCL 20 MG PO TABS: 20.0000 mg | ORAL_TABLET | Freq: Three times a day (TID) | ORAL | 0 refills | Status: DC

## 2017-11-13 MED ORDER — SODIUM CHLORIDE 0.9 % IV BOLUS (SEPSIS)
1000.0000 mL | Freq: Once | INTRAVENOUS | Status: AC
  Administered 2017-11-13: 1000 mL via INTRAVENOUS

## 2017-11-13 MED ORDER — ONDANSETRON 4 MG PO TBDP: 4.0000 mg | ORAL_TABLET | Freq: Four times a day (QID) | ORAL | 0 refills | Status: DC | PRN

## 2017-11-13 MED ORDER — METOCLOPRAMIDE HCL 5 MG/ML IJ SOLN
10.0000 mg | Freq: Once | INTRAMUSCULAR | Status: AC
  Administered 2017-11-13: 10 mg via INTRAVENOUS
  Filled 2017-11-13: qty 2

## 2017-11-13 MED ORDER — KETOROLAC TROMETHAMINE 30 MG/ML IJ SOLN
30.0000 mg | Freq: Once | INTRAMUSCULAR | Status: AC
  Administered 2017-11-13: 30 mg via INTRAVENOUS
  Filled 2017-11-13: qty 1

## 2017-11-13 NOTE — ED Provider Notes (Signed)
TIME SEEN: 12:46 AM  CHIEF COMPLAINT: Abdominal pain  HPI: Patient is a 32 year old female with history of fibromyalgia, bipolar disorder, hepatitis C, IV heroin abuse who presents to the emergency department with complaints of abdominal pain.  Patient had a lap cholecystectomy for chronic cholecystitis by Dr. Dema Severin on 11/06/17.  He reports she has had increased pain since that time is having some mild increased bruising around her umbilicus.  No drainage from any of her wounds.  Reports subjective fevers, nausea and constipation.  Last bowel movement was yesterday.  No bloody stool or melena.  ROS: See HPI Constitutional: no fever  Eyes: no drainage  ENT: no runny nose   Cardiovascular:  no chest pain  Resp: no SOB  GI: no vomiting GU: no dysuria Integumentary: no rash  Allergy: no hives  Musculoskeletal: no leg swelling  Neurological: no slurred speech ROS otherwise negative  PAST MEDICAL HISTORY/PAST SURGICAL HISTORY:  Past Medical History:  Diagnosis Date  . Anxiety and depression   . Bipolar disorder ([place])   . Cervical cancer ([place])    Unclear if patient had cervical cancer/ dysplasia, but underwent cryotherapy  . Chronic lower back pain   . Depression   . DVT (deep venous thrombosis) ([place]) 2010; 2016   "behind my knees; after my c-sections"  . Fibromyalgia   . Frequent UTI   . GERD (gastroesophageal reflux disease)   . Heart murmur    "born w/one; outgrew it" (11/03/2017)  . Hepatitis C   . Melanoma of forearm, right ([place])   . Migraine    "3-4/wk" (11/03/2017)  . PTSD (post-traumatic stress disorder)   . Rheumatoid arthritis ([place])     MEDICATIONS:  Prior to Admission medications   Medication Sig Start Date End Date Taking? Authorizing Provider  cefpodoxime (VANTIN) 200 MG tablet Take 1 tablet (200 mg total) by mouth 2 (two) times daily for 3 days. 11/12/17 11/15/17  Mariel Aloe, MD  ondansetron (ZOFRAN-ODT) 4 MG disintegrating tablet Take 1 tablet (4 mg total) by  mouth every 6 (six) hours as needed for nausea. 11/11/17   Mariel Aloe, MD  polyethylene glycol (MIRALAX / GLYCOLAX) packet Take 17 g by mouth daily. 11/11/17   Mariel Aloe, MD    ALLERGIES:  Allergies  Allergen Reactions  . Citric Acid Anaphylaxis    fruit  . Citrus Anaphylaxis    rash  . Hydromorphone Hcl Itching  . Acetaminophen Other (See Comments)    Causes hematuria in large amounts  . Aspirin Other (See Comments)    epitaxis,hematuria  . Erythromycin Swelling and Rash  . Ibuprofen Other (See Comments)    Causes hematuria in large amounts.  . Adhesive [Tape] Other (See Comments)    Blisters     SOCIAL HISTORY:  Social History   Tobacco Use  . Smoking status: Current Some Day Smoker    Packs/day: 2.00    Years: 21.00    Pack years: 42.00    Types: Cigarettes  . Smokeless tobacco: Never Used  Substance Use Topics  . Alcohol use: No    Frequency: Never    FAMILY HISTORY: Family History  Problem Relation Age of Onset  . Heart disease Father     EXAM: BP 104/69 (BP Location: Left Arm)   Pulse 82   Temp 97.9 F (36.6 C) (Oral)   Resp 16   LMP 11/01/2017   SpO2 98%  CONSTITUTIONAL: Alert and oriented and responds appropriately to questions.  Chronically ill-appearing HEAD:  Normocephalic EYES: Conjunctivae clear, pupils appear equal, EOMI ENT: normal nose; moist mucous membranes NECK: Supple, no meningismus, no nuchal rigidity, no LAD  CARD: RRR; S1 and S2 appreciated; no murmurs, no clicks, no rubs, no gallops RESP: Normal chest excursion without splinting or tachypnea; breath sounds clear and equal bilaterally; no wheezes, no rhonchi, no rales, no hypoxia or respiratory distress, speaking full sentences ABD/GI: Normal bowel sounds; non-distended; soft, tender to palpation throughout the abdomen especially in the lower aspect of the abdomen, no rebound, no guarding, no peritoneal signs, no hepatosplenomegaly, multiple laparoscopic surgical scars that are  clean, dry and intact without erythema or warmth or any purulent drainage or bleeding.  Mild amount of bruising that appears old around the umbilicus.  No hematoma appreciated. BACK:  The back appears normal and is non-tender to palpation, there is no CVA tenderness EXT: Normal ROM in all joints; non-tender to palpation; no edema; normal capillary refill; no cyanosis, no calf tenderness or swelling    SKIN: Normal color for age and race; warm; no rash NEURO: Moves all extremities equally PSYCH: The patient's mood and manner are appropriate. Grooming and personal hygiene are appropriate.  MEDICAL DECISION MAKING: Patient here with abdominal pain postoperatively.  I suspect this may be normal postoperative pain but patient is quite histrionic and has a history of IV drug abuse.  Unfortunately given I cannot rule out acute surgical complication such as abscess with her complaints of fever and I cannot rule out bowel obstruction given her complaints of decreased bowel movements, will obtain CT of her abdomen pelvis.  Labs obtained in triage are unremarkable.  Lactate was mildly elevated at 1.98 but she does not appear septic.  She has elevated LFTs and lipase which is chronic for her and likely secondary to her hepatitis C.  She was also being treated with Vantin for pyelonephritis.  Urine today does not appear infected.  ED PROGRESS: Patient repeatedly asking nursing staff for pain and nausea medicine.  We are attempting to avoid any sedative medications given her history.  CT scan shows minimal fluid in the gallbladder fossa that is unchanged from recent CT that is likely a postoperative seroma.  No sign of abscess or bile leak.  No other acute abnormality.  Enhancement of the right kidney is no longer seen.  Have advised her to continue her Vantin.  Will discharge her with Bentyl and Zofran.  She is not vomiting here and does not appear uncomfortable.  She has had normal vital signs.  I feel there may be a  component of drug-seeking behavior.  I feel she is safe to be discharged with outpatient follow-up.  She has a PCP.  At this time, I do not feel there is any life-threatening condition present. I have reviewed and discussed all results (EKG, imaging, lab, urine as appropriate) and exam findings with patient/family. I have reviewed nursing notes and appropriate previous records.  I feel the patient is safe to be discharged home without further emergent workup and can continue workup as an outpatient as needed. Discussed usual and customary return precautions. Patient/family verbalize understanding and are comfortable with this plan.  Outpatient follow-up has been provided if needed. All questions have been answered.      Roslin Norwood, Delice Bison, DO 11/13/17 [phone]

## 2017-12-10 ENCOUNTER — Emergency Department (HOSPITAL_COMMUNITY): Payer: Medicaid Other

## 2017-12-10 ENCOUNTER — Emergency Department (HOSPITAL_COMMUNITY)
Admission: EM | Admit: 2017-12-10 | Discharge: 2017-12-10 | Disposition: A | Discharge status: Home / Self Care | Payer: Medicaid Other | Attending: Emergency Medicine | Admitting: Emergency Medicine

## 2017-12-10 ENCOUNTER — Encounter (HOSPITAL_COMMUNITY): Payer: Self-pay

## 2017-12-10 ENCOUNTER — Other Ambulatory Visit: Payer: Self-pay

## 2017-12-10 DIAGNOSIS — Z8541 Personal history of malignant neoplasm of cervix uteri: Secondary | ICD-10-CM | POA: Insufficient documentation

## 2017-12-10 DIAGNOSIS — R1010 Upper abdominal pain, unspecified: Secondary | ICD-10-CM | POA: Diagnosis not present

## 2017-12-10 DIAGNOSIS — F17210 Nicotine dependence, cigarettes, uncomplicated: Secondary | ICD-10-CM | POA: Diagnosis not present

## 2017-12-10 DIAGNOSIS — R109 Unspecified abdominal pain: Secondary | ICD-10-CM

## 2017-12-10 DIAGNOSIS — R1110 Vomiting, unspecified: Secondary | ICD-10-CM | POA: Diagnosis not present

## 2017-12-10 LAB — COMPREHENSIVE METABOLIC PANEL
ALBUMIN: 4.1 g/dL (ref 3.5–5.0)
ALT: 166 U/L — ABNORMAL HIGH (ref 14–54)
ANION GAP: 10 (ref 5–15)
AST: 95 U/L — ABNORMAL HIGH (ref 15–41)
Alkaline Phosphatase: 94 U/L (ref 38–126)
BILIRUBIN TOTAL: 1 mg/dL (ref 0.3–1.2)
BUN: 12 mg/dL (ref 6–20)
CO2: 20 mmol/L — ABNORMAL LOW (ref 22–32)
Calcium: 9.3 mg/dL (ref 8.9–10.3)
Chloride: 107 mmol/L (ref 101–111)
Creatinine, Ser: 0.7 mg/dL (ref 0.44–1.00)
GFR calc Af Amer: >60 mL/min (ref >60)
GFR calc non Af Amer: >60 mL/min (ref >60)
GLUCOSE: 104 mg/dL — AB (ref 65–99)
POTASSIUM: 3.7 mmol/L (ref 3.5–5.1)
Sodium: 137 mmol/L (ref 135–145)
TOTAL PROTEIN: 7.8 g/dL (ref 6.5–8.1)

## 2017-12-10 LAB — CBC
HEMATOCRIT: 38.8 % (ref 36.0–46.0)
HEMOGLOBIN: 13.1 g/dL (ref 12.0–15.0)
MCH: 29.2 pg (ref 26.0–34.0)
MCHC: 33.8 g/dL (ref 30.0–36.0)
MCV: 86.6 fL (ref 78.0–100.0)
Platelets: 200 10*3/uL (ref 150–400)
RBC: 4.48 MIL/uL (ref 3.87–5.11)
RDW: 14.5 % (ref 11.5–15.5)
WBC: 6.8 10*3/uL (ref 4.0–10.5)

## 2017-12-10 LAB — URINALYSIS, ROUTINE W REFLEX MICROSCOPIC
BILIRUBIN URINE: NEGATIVE
GLUCOSE, UA: NEGATIVE mg/dL
Ketones, ur: NEGATIVE mg/dL
LEUKOCYTES UA: NEGATIVE
NITRITE: NEGATIVE
PH: 8 (ref 5.0–8.0)
Protein, ur: NEGATIVE mg/dL
SPECIFIC GRAVITY, URINE: 1.009 (ref 1.005–1.030)

## 2017-12-10 LAB — I-STAT BETA HCG BLOOD, ED (MC, WL, AP ONLY): I-stat hCG, quantitative: <5 m[IU]/mL (ref <5)

## 2017-12-10 LAB — LIPASE, BLOOD: Lipase: 51 U/L (ref 11–51)

## 2017-12-10 MED ORDER — LACTATED RINGERS IV BOLUS (SEPSIS): 1000.0000 mL | Freq: Once | INTRAVENOUS | Status: DC

## 2017-12-10 MED ORDER — SUCRALFATE 1 G PO TABS: 1.0000 g | ORAL_TABLET | Freq: Three times a day (TID) | ORAL | 0 refills | Status: DC

## 2017-12-10 MED ORDER — ALUM & MAG HYDROXIDE-SIMETH 200-200-20 MG/5ML PO SUSP
30.0000 mL | Freq: Once | ORAL | Status: AC
  Administered 2017-12-10: 30 mL via ORAL
  Filled 2017-12-10: qty 30

## 2017-12-10 MED ORDER — MORPHINE SULFATE (PF) 4 MG/ML IV SOLN
4.0000 mg | Freq: Once | INTRAVENOUS | Status: AC
  Administered 2017-12-10: 4 mg via INTRAVENOUS
  Filled 2017-12-10: qty 1

## 2017-12-10 MED ORDER — METOCLOPRAMIDE HCL 5 MG/ML IJ SOLN
10.0000 mg | Freq: Once | INTRAMUSCULAR | Status: AC
  Administered 2017-12-10: 10 mg via INTRAVENOUS
  Filled 2017-12-10: qty 2

## 2017-12-10 MED ORDER — OMEPRAZOLE 20 MG PO CPDR: 20.0000 mg | DELAYED_RELEASE_CAPSULE | Freq: Every day | ORAL | 0 refills | Status: DC

## 2017-12-10 MED ORDER — SUCRALFATE 1 G PO TABS
1.0000 g | ORAL_TABLET | Freq: Once | ORAL | Status: AC
  Administered 2017-12-10: 1 g via ORAL
  Filled 2017-12-10: qty 1

## 2017-12-10 MED ORDER — ONDANSETRON HCL 4 MG/2ML IJ SOLN
4.0000 mg | Freq: Once | INTRAMUSCULAR | Status: AC
  Administered 2017-12-10: 4 mg via INTRAVENOUS
  Filled 2017-12-10: qty 2

## 2017-12-10 MED ORDER — IOPAMIDOL (ISOVUE-300) INJECTION 61%
INTRAVENOUS | Status: AC
  Administered 2017-12-10: 100 mL
  Filled 2017-12-10: qty 100

## 2017-12-10 MED ORDER — ONDANSETRON 8 MG PO TBDP: 8.0000 mg | ORAL_TABLET | Freq: Three times a day (TID) | ORAL | 0 refills | Status: DC | PRN

## 2017-12-10 MED ORDER — SODIUM CHLORIDE 0.9 % IV BOLUS (SEPSIS)
1000.0000 mL | Freq: Once | INTRAVENOUS | Status: AC
  Administered 2017-12-10: 1000 mL via INTRAVENOUS

## 2017-12-10 NOTE — ED Notes (Signed)
Patient is resting comfortably. 

## 2017-12-10 NOTE — ED Notes (Signed)
ED Provider at bedside. 

## 2017-12-10 NOTE — ED Provider Notes (Signed)
Patient visit shared.  She has a history of cholecystectomy in late December and presents for evaluation of upper abdominal pain, vomiting and diarrhea.  Patient care assumed pending CT abdomen.  CT abdomen and pelvis with no concerning features.  Patient's pain is significantly improved on repeat assessments and she is able to tolerate oral fluids in the department.  Plan to discharge home with gastroenterology follow-up.   Quintella Reichert, MD 12/10/17 1745

## 2017-12-10 NOTE — ED Notes (Signed)
Patient transported to x-ray. ?

## 2017-12-10 NOTE — ED Notes (Signed)
Pt thinks her increased abdominal pain may be due to food she ate at a shelter last night; states it is usually some kind of leftovers and last night it was pork BBQ

## 2017-12-10 NOTE — ED Notes (Signed)
Patient verbalizes understanding of discharge instructions. Opportunity for questioning and answers were provided. Armband removed by staff, pt discharged from ED ambulatory.   

## 2017-12-10 NOTE — ED Triage Notes (Signed)
Pt had gallbladder removed approx one month ago, has had ongoing abdominal pain since. Nausea/vomitting since 2300 last night, called and brought in by [facility] as it was not resolved this morning.
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
## 2018-05-20 ENCOUNTER — Emergency Department (HOSPITAL_COMMUNITY)
Admission: EM | Admit: 2018-05-20 | Discharge: 2018-05-21 | Disposition: A | Discharge status: Home / Self Care | Payer: Medicaid Other | Attending: Emergency Medicine | Admitting: Emergency Medicine

## 2018-05-20 ENCOUNTER — Other Ambulatory Visit: Payer: Self-pay

## 2018-05-20 ENCOUNTER — Encounter (HOSPITAL_COMMUNITY): Payer: Self-pay

## 2018-05-20 DIAGNOSIS — Z8541 Personal history of malignant neoplasm of cervix uteri: Secondary | ICD-10-CM | POA: Insufficient documentation

## 2018-05-20 DIAGNOSIS — O99331 Smoking (tobacco) complicating pregnancy, first trimester: Secondary | ICD-10-CM | POA: Insufficient documentation

## 2018-05-20 DIAGNOSIS — F17210 Nicotine dependence, cigarettes, uncomplicated: Secondary | ICD-10-CM | POA: Insufficient documentation

## 2018-05-20 DIAGNOSIS — O9989 Other specified diseases and conditions complicating pregnancy, childbirth and the puerperium: Secondary | ICD-10-CM | POA: Diagnosis not present

## 2018-05-20 DIAGNOSIS — R42 Dizziness and giddiness: Secondary | ICD-10-CM | POA: Insufficient documentation

## 2018-05-20 DIAGNOSIS — Z3A01 Less than 8 weeks gestation of pregnancy: Secondary | ICD-10-CM | POA: Diagnosis not present

## 2018-05-20 DIAGNOSIS — Z3201 Encounter for pregnancy test, result positive: Secondary | ICD-10-CM | POA: Diagnosis not present

## 2018-05-20 DIAGNOSIS — Z79899 Other long term (current) drug therapy: Secondary | ICD-10-CM | POA: Diagnosis not present

## 2018-05-20 LAB — BASIC METABOLIC PANEL
Anion gap: 11 (ref 5–15)
BUN: 11 mg/dL (ref 6–20)
CALCIUM: 8.9 mg/dL (ref 8.9–10.3)
CO2: 22 mmol/L (ref 22–32)
CREATININE: 0.78 mg/dL (ref 0.44–1.00)
Chloride: 105 mmol/L (ref 98–111)
GFR calc Af Amer: >60 mL/min (ref >60)
Glucose, Bld: 121 mg/dL — ABNORMAL HIGH (ref 70–99)
Potassium: 3.3 mmol/L — ABNORMAL LOW (ref 3.5–5.1)
SODIUM: 138 mmol/L (ref 135–145)

## 2018-05-20 LAB — CBC
HCT: 39.5 % (ref 36.0–46.0)
Hemoglobin: 12.5 g/dL (ref 12.0–15.0)
MCH: 27.9 pg (ref 26.0–34.0)
MCHC: 31.6 g/dL (ref 30.0–36.0)
MCV: 88.2 fL (ref 78.0–100.0)
PLATELETS: 270 10*3/uL (ref 150–400)
RBC: 4.48 MIL/uL (ref 3.87–5.11)
RDW: 14.8 % (ref 11.5–15.5)
WBC: 11.9 10*3/uL — AB (ref 4.0–10.5)

## 2018-05-20 LAB — URINALYSIS, ROUTINE W REFLEX MICROSCOPIC
Bilirubin Urine: NEGATIVE
GLUCOSE, UA: NEGATIVE mg/dL
Hgb urine dipstick: NEGATIVE
KETONES UR: NEGATIVE mg/dL
LEUKOCYTES UA: NEGATIVE
Nitrite: NEGATIVE
Protein, ur: NEGATIVE mg/dL
Specific Gravity, Urine: 1.026 (ref 1.005–1.030)
pH: 5 (ref 5.0–8.0)

## 2018-05-20 LAB — I-STAT BETA HCG BLOOD, ED (MC, WL, AP ONLY): I-stat hCG, quantitative: 153.5 m[IU]/mL — ABNORMAL HIGH (ref <5)

## 2018-05-20 MED ORDER — ONDANSETRON HCL 4 MG PO TABS
4.0000 mg | ORAL_TABLET | Freq: Once | ORAL | Status: AC
  Administered 2018-05-20: 4 mg via ORAL
  Filled 2018-05-20: qty 1

## 2018-05-20 NOTE — ED Triage Notes (Signed)
Pt states that for the past week she has been dizzy, n/v everyday, denies diarrhea or fevers. States she could possibly be pregnant.

## 2018-05-20 NOTE — ED Notes (Signed)
Pt requesting nausea medication

## 2018-05-21 MED ORDER — BUDESONIDE-FORMOTEROL FUMARATE 160-4.5 MCG/ACT IN AERO: 2.0000 | INHALATION_SPRAY | Freq: Two times a day (BID) | RESPIRATORY_TRACT | 3 refills | Status: AC

## 2018-05-21 MED ORDER — DOXYLAMINE-PYRIDOXINE 10-10 MG PO TBEC: 2.0000 | DELAYED_RELEASE_TABLET | Freq: Every day | ORAL | 2 refills | Status: DC

## 2018-05-21 MED ORDER — ONDANSETRON HCL 4 MG PO TABS: 4.0000 mg | ORAL_TABLET | Freq: Four times a day (QID) | ORAL | 0 refills | Status: DC

## 2018-05-21 MED ORDER — RANITIDINE HCL 150 MG PO TABS: 150.0000 mg | ORAL_TABLET | Freq: Two times a day (BID) | ORAL | 3 refills | Status: DC

## 2018-05-21 MED ORDER — ALBUTEROL SULFATE HFA 108 (90 BASE) MCG/ACT IN AERS: 2.0000 | INHALATION_SPRAY | RESPIRATORY_TRACT | 2 refills | Status: AC | PRN

## 2018-05-21 NOTE — ED Notes (Signed)
Pt refused d/c vital signs stating her ride was here and she needed to leave. D/c instructions and medications reviewed with patient. Pt verbalized understanding.

## 2018-05-21 NOTE — ED Notes (Signed)
ED Provider at bedside. 
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
## 2018-05-27 ENCOUNTER — Encounter (HOSPITAL_COMMUNITY): Payer: Self-pay | Admitting: *Deleted

## 2018-05-27 ENCOUNTER — Inpatient Hospital Stay (HOSPITAL_COMMUNITY)
Admission: AD | Admit: 2018-05-27 | Discharge: 2018-05-27 | Disposition: A | Discharge status: Home / Self Care | Payer: Medicaid Other | Source: Ambulatory Visit | Attending: Obstetrics & Gynecology | Admitting: Obstetrics & Gynecology

## 2018-05-27 DIAGNOSIS — O99341 Other mental disorders complicating pregnancy, first trimester: Secondary | ICD-10-CM | POA: Insufficient documentation

## 2018-05-27 DIAGNOSIS — O219 Vomiting of pregnancy, unspecified: Secondary | ICD-10-CM | POA: Insufficient documentation

## 2018-05-27 DIAGNOSIS — R197 Diarrhea, unspecified: Secondary | ICD-10-CM | POA: Insufficient documentation

## 2018-05-27 DIAGNOSIS — Z886 Allergy status to analgesic agent status: Secondary | ICD-10-CM | POA: Diagnosis not present

## 2018-05-27 DIAGNOSIS — Z881 Allergy status to other antibiotic agents status: Secondary | ICD-10-CM | POA: Diagnosis not present

## 2018-05-27 DIAGNOSIS — O99331 Smoking (tobacco) complicating pregnancy, first trimester: Secondary | ICD-10-CM | POA: Diagnosis not present

## 2018-05-27 DIAGNOSIS — O99611 Diseases of the digestive system complicating pregnancy, first trimester: Secondary | ICD-10-CM | POA: Diagnosis not present

## 2018-05-27 DIAGNOSIS — F17210 Nicotine dependence, cigarettes, uncomplicated: Secondary | ICD-10-CM | POA: Diagnosis not present

## 2018-05-27 DIAGNOSIS — F319 Bipolar disorder, unspecified: Secondary | ICD-10-CM | POA: Insufficient documentation

## 2018-05-27 DIAGNOSIS — K219 Gastro-esophageal reflux disease without esophagitis: Secondary | ICD-10-CM | POA: Insufficient documentation

## 2018-05-27 DIAGNOSIS — Z3A01 Less than 8 weeks gestation of pregnancy: Secondary | ICD-10-CM | POA: Diagnosis not present

## 2018-05-27 DIAGNOSIS — O26891 Other specified pregnancy related conditions, first trimester: Secondary | ICD-10-CM | POA: Insufficient documentation

## 2018-05-27 LAB — URINALYSIS, ROUTINE W REFLEX MICROSCOPIC
BILIRUBIN URINE: NEGATIVE
Glucose, UA: NEGATIVE mg/dL
Hgb urine dipstick: NEGATIVE
Ketones, ur: NEGATIVE mg/dL
LEUKOCYTES UA: NEGATIVE
Nitrite: POSITIVE — AB
PH: 7 (ref 5.0–8.0)
Protein, ur: NEGATIVE mg/dL
SPECIFIC GRAVITY, URINE: 1.012 (ref 1.005–1.030)

## 2018-05-27 LAB — RAPID URINE DRUG SCREEN, HOSP PERFORMED
Amphetamines: NOT DETECTED
BARBITURATES: NOT DETECTED
Benzodiazepines: NOT DETECTED
Cocaine: NOT DETECTED
Opiates: NOT DETECTED
TETRAHYDROCANNABINOL: POSITIVE — AB

## 2018-05-27 MED ORDER — PROMETHAZINE HCL 25 MG PO TABS: 12.5000 mg | ORAL_TABLET | Freq: Four times a day (QID) | ORAL | 3 refills | Status: DC | PRN

## 2018-05-27 MED ORDER — LOPERAMIDE HCL 2 MG PO CAPS
4.0000 mg | ORAL_CAPSULE | Freq: Once | ORAL | Status: AC
  Administered 2018-05-27: 4 mg via ORAL
  Filled 2018-05-27: qty 2

## 2018-05-27 MED ORDER — PROMETHAZINE HCL 25 MG/ML IJ SOLN
25.0000 mg | Freq: Once | INTRAMUSCULAR | Status: AC
Start: 2018-05-27 — End: 2018-05-27
  Administered 2018-05-27: 25 mg via INTRAMUSCULAR
  Filled 2018-05-27: qty 1

## 2018-05-27 NOTE — MAU Note (Signed)
Pt C/O dizziness, also N&V for the last week.  Also diarrhea for the last 24 hours - 7-8 episodes.  Denies fever.  Has occasional abdominal cramping, no bleeding.

## 2018-05-27 NOTE — Discharge Instructions (Signed)
Prenatal Care Providers [facility]  [phone]     Phone: [place]                      [facility]  @[email or web address]     Phone: [phone]  Phone: [facility]     Phone: [phone]  Phone: [phone]           [place] [facility] @ [place]                hone: [phone]  Phone: [phone]         [facility]                                Phone: [place] [facility].                Phone: [phone]  [place])          Phone: [phone] [facility]   Phone: [phone] Safe Medications in Pregnancy   Acne: Benzoyl Peroxide Salicylic Acid  Backache/Headache: Tylenol: 2 regular strength every 4 hours OR              2 Extra strength every 6 hours  Colds/Coughs/Allergies: Benadryl (alcohol free) 25 mg every 6 hours as needed Breath right strips Claritin Cepacol throat lozenges Chloraseptic throat spray Cold-Eeze- up to three times per day Cough drops, alcohol free Flonase (by prescription only) Guaifenesin Mucinex Robitussin DM (plain only, alcohol free) Saline nasal spray/drops Sudafed (pseudoephedrine) & Actifed ** use only after [redacted] weeks gestation and if you do not have high blood pressure Tylenol Vicks Vaporub Zinc lozenges Zyrtec   Constipation: Colace Ducolax suppositories Fleet enema Glycerin suppositories Metamucil Milk of magnesia Miralax Senokot Smooth move tea  Diarrhea: Kaopectate Imodium A-D  *NO pepto Bismol  Hemorrhoids: Anusol Anusol HC Preparation H Tucks  Indigestion: Tums Maalox Mylanta Zantac  Pepcid  Insomnia: Benadryl (alcohol free) 25mg  every 6 hours as needed Tylenol PM Unisom, no  Gelcaps  Leg Cramps: Tums MagGel  Nausea/Vomiting:  Bonine Dramamine Emetrol Ginger extract Sea bands Meclizine  Nausea medication to take during pregnancy:  Unisom (doxylamine succinate 25 mg tablets) Take one tablet daily at bedtime. If symptoms are not adequately controlled, the dose can be increased to a maximum recommended dose of two tablets daily (1/2 tablet in the morning, 1/2 tablet mid-afternoon and one at bedtime). Vitamin B6 100mg  tablets. Take one tablet twice a day (up to 200 mg per day).  Skin Rashes: Aveeno products Benadryl cream or 25mg  every 6 hours as needed Calamine Lotion 1% cortisone cream  Yeast infection: Gyne-lotrimin 7 Monistat 7   **If taking multiple medications, please check labels to avoid duplicating the same active ingredients **take medication as directed on the label ** Do not exceed 4000 mg of tylenol in 24 hours **Do not take medications that contain aspirin or ibuprofen
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
## 2018-05-28 LAB — GASTROINTESTINAL PANEL BY PCR, STOOL (REPLACES STOOL CULTURE)
Adenovirus F40/41: NOT DETECTED
Astrovirus: NOT DETECTED
CRYPTOSPORIDIUM: NOT DETECTED
Campylobacter species: NOT DETECTED
Cyclospora cayetanensis: NOT DETECTED
ENTEROAGGREGATIVE E COLI (EAEC): NOT DETECTED
Entamoeba histolytica: NOT DETECTED
Enteropathogenic E coli (EPEC): NOT DETECTED
Enterotoxigenic E coli (ETEC): NOT DETECTED
GIARDIA LAMBLIA: NOT DETECTED
NOROVIRUS GI/GII: NOT DETECTED
PLESIMONAS SHIGELLOIDES: NOT DETECTED
ROTAVIRUS A: NOT DETECTED
SALMONELLA SPECIES: NOT DETECTED
SAPOVIRUS (I, II, IV, AND V): NOT DETECTED
SHIGA LIKE TOXIN PRODUCING E COLI (STEC): NOT DETECTED
SHIGELLA/ENTEROINVASIVE E COLI (EIEC): NOT DETECTED
Vibrio cholerae: NOT DETECTED
Vibrio species: NOT DETECTED
Yersinia enterocolitica: NOT DETECTED

## 2018-05-29 LAB — CULTURE, OB URINE

## 2018-05-30 ENCOUNTER — Other Ambulatory Visit: Payer: Self-pay | Admitting: Advanced Practice Midwife

## 2018-05-30 MED ORDER — SULFAMETHOXAZOLE-TRIMETHOPRIM 400-80 MG PO TABS: 1.0000 | ORAL_TABLET | Freq: Two times a day (BID) | ORAL | 0 refills | Status: DC

## 2018-05-30 NOTE — Progress Notes (Signed)
Attempted to call patient with stool sample results and urine culture results. No answer. Antibiotic sent in for UTI to the pharmacy on file. Bactrim DS BID x 7 days per urine culture sensitivity.   [name] 12:36 PM 05/30/18

## 2018-05-31 ENCOUNTER — Telehealth: Payer: Self-pay | Admitting: Advanced Practice Midwife

## 2018-05-31 NOTE — Telephone Encounter (Signed)
Called patient to notify her of diagnosis and availability of her prescription. She verbalized understanding and plans to pick up rx today. Mallie Snooks, CNM 05/31/18  10:23 AM

## 2018-06-14 ENCOUNTER — Inpatient Hospital Stay (HOSPITAL_COMMUNITY)
Admission: AD | Admit: 2018-06-14 | Discharge: 2018-06-14 | Disposition: A | Discharge status: Home / Self Care | Payer: Medicaid Other | Source: Ambulatory Visit | Attending: Obstetrics & Gynecology | Admitting: Obstetrics & Gynecology

## 2018-06-14 ENCOUNTER — Inpatient Hospital Stay (HOSPITAL_COMMUNITY): Payer: Medicaid Other

## 2018-06-14 ENCOUNTER — Encounter (HOSPITAL_COMMUNITY): Payer: Self-pay | Admitting: *Deleted

## 2018-06-14 DIAGNOSIS — O209 Hemorrhage in early pregnancy, unspecified: Secondary | ICD-10-CM | POA: Diagnosis not present

## 2018-06-14 DIAGNOSIS — Z3A10 10 weeks gestation of pregnancy: Secondary | ICD-10-CM | POA: Insufficient documentation

## 2018-06-14 DIAGNOSIS — Z3491 Encounter for supervision of normal pregnancy, unspecified, first trimester: Secondary | ICD-10-CM

## 2018-06-14 DIAGNOSIS — O208 Other hemorrhage in early pregnancy: Secondary | ICD-10-CM | POA: Insufficient documentation

## 2018-06-14 DIAGNOSIS — O418X10 Other specified disorders of amniotic fluid and membranes, first trimester, not applicable or unspecified: Secondary | ICD-10-CM

## 2018-06-14 DIAGNOSIS — M25551 Pain in right hip: Secondary | ICD-10-CM

## 2018-06-14 DIAGNOSIS — Y93E1 Activity, personal bathing and showering: Secondary | ICD-10-CM | POA: Insufficient documentation

## 2018-06-14 DIAGNOSIS — W182XXA Fall in (into) shower or empty bathtub, initial encounter: Secondary | ICD-10-CM | POA: Diagnosis not present

## 2018-06-14 DIAGNOSIS — O99331 Smoking (tobacco) complicating pregnancy, first trimester: Secondary | ICD-10-CM | POA: Diagnosis not present

## 2018-06-14 DIAGNOSIS — Z3A01 Less than 8 weeks gestation of pregnancy: Secondary | ICD-10-CM | POA: Diagnosis not present

## 2018-06-14 DIAGNOSIS — O26891 Other specified pregnancy related conditions, first trimester: Secondary | ICD-10-CM | POA: Diagnosis not present

## 2018-06-14 DIAGNOSIS — O468X1 Other antepartum hemorrhage, first trimester: Secondary | ICD-10-CM

## 2018-06-14 LAB — WET PREP, GENITAL
CLUE CELLS WET PREP: NONE SEEN
Sperm: NONE SEEN
TRICH WET PREP: NONE SEEN

## 2018-06-14 LAB — CBC WITH DIFFERENTIAL/PLATELET
Basophils Absolute: 0 10*3/uL (ref 0.0–0.1)
Basophils Relative: 0 %
Eosinophils Absolute: 0.1 10*3/uL (ref 0.0–0.7)
Eosinophils Relative: 1 %
HEMATOCRIT: 38.1 % (ref 36.0–46.0)
HEMOGLOBIN: 12.9 g/dL (ref 12.0–15.0)
LYMPHS ABS: 2.2 10*3/uL (ref 0.7–4.0)
Lymphocytes Relative: 26 %
MCH: 28.5 pg (ref 26.0–34.0)
MCHC: 33.9 g/dL (ref 30.0–36.0)
MCV: 84.1 fL (ref 78.0–100.0)
MONOS PCT: 6 %
Monocytes Absolute: 0.5 10*3/uL (ref 0.1–1.0)
NEUTROS PCT: 67 %
Neutro Abs: 5.7 10*3/uL (ref 1.7–7.7)
Platelets: 210 10*3/uL (ref 150–400)
RBC: 4.53 MIL/uL (ref 3.87–5.11)
RDW: 13.2 % (ref 11.5–15.5)
WBC: 8.5 10*3/uL (ref 4.0–10.5)

## 2018-06-14 LAB — URINALYSIS, ROUTINE W REFLEX MICROSCOPIC
BILIRUBIN URINE: NEGATIVE
Glucose, UA: NEGATIVE mg/dL
Hgb urine dipstick: NEGATIVE
Ketones, ur: NEGATIVE mg/dL
Leukocytes, UA: NEGATIVE
NITRITE: NEGATIVE
PH: 7 (ref 5.0–8.0)
Protein, ur: NEGATIVE mg/dL
SPECIFIC GRAVITY, URINE: 1.014 (ref 1.005–1.030)

## 2018-06-14 LAB — HCG, QUANTITATIVE, PREGNANCY: hCG, Beta Chain, Quant, S: 67506 m[IU]/mL — ABNORMAL HIGH (ref <5)

## 2018-06-14 MED ORDER — ONDANSETRON 8 MG PO TBDP
8.0000 mg | ORAL_TABLET | Freq: Once | ORAL | Status: AC
  Administered 2018-06-14: 8 mg via ORAL
  Filled 2018-06-14: qty 1

## 2018-06-14 MED ORDER — KETOROLAC TROMETHAMINE 60 MG/2ML IM SOLN
60.0000 mg | Freq: Once | INTRAMUSCULAR | Status: AC
Start: 2018-06-14 — End: 2018-06-14
  Administered 2018-06-14: 60 mg via INTRAMUSCULAR
  Filled 2018-06-14: qty 2

## 2018-06-14 NOTE — MAU Note (Signed)
Pt arrive EMS. Reports that pt fell as she was getting out of the shower. Having pain from her knee to her hip. Reports she had some bleeding after when she wipes. VS WNL.

## 2018-06-14 NOTE — MAU Note (Signed)
Pt also c/o spotting after she fell.

## 2018-06-14 NOTE — Discharge Instructions (Signed)
Hip Pain The hip is the joint between the upper legs and the lower pelvis. The bones, cartilage, tendons, and muscles of your hip joint support your body and allow you to move around. Hip pain can range from a minor ache to severe pain in one or both of your hips. The pain may be felt on the inside of the hip joint near the groin, or the outside near the buttocks and upper thigh. You may also have swelling or stiffness. Follow these instructions at home: Managing pain, stiffness, and swelling  If directed, apply ice to the injured area. ? Put ice in a plastic bag. ? Place a towel between your skin and the bag. ? Leave the ice on for 20 minutes, 2-3 times a day  Sleep with a pillow between your legs on your most comfortable side.  Avoid any activities that cause pain. General instructions  Take over-the-counter and prescription medicines only as told by your health care provider.  Do any exercises as told by your health care provider.  Record the following: ? How often you have hip pain. ? The location of your pain. ? What the pain feels like. ? What makes the pain worse.  Keep all follow-up visits as told by your health care provider. This is important. Contact a health care provider if:  You cannot put weight on your leg.  Your pain or swelling continues or gets worse after one week.  It gets harder to walk.  You have a fever. Get help right away if:  You fall.  You have a sudden increase in pain and swelling in your hip.  Your hip is red or swollen or very tender to touch. Summary  Hip pain can range from a minor ache to severe pain in one or both of your hips.  The pain may be felt on the inside of the hip joint near the groin, or the outside near the buttocks and upper thigh.  Avoid any activities that cause pain.  Record how often you have hip pain, the location of the pain, what makes it worse and what it feels like. This information is not intended to  replace advice given to you by your health care provider. Make sure you discuss any questions you have with your health care provider. Document Released: 04/17/2010 Document Revised: 09/30/2016 Document Reviewed: 09/30/2016 Elsevier Interactive Patient Education  2018 [employer]. Vaginal Bleeding During Pregnancy, First Trimester A small amount of bleeding (spotting) from the vagina is relatively common in early pregnancy. It usually stops on its own. Various things may cause bleeding or spotting in early pregnancy. Some bleeding may be related to the pregnancy, and some may not. In most cases, the bleeding is normal and is not a problem. However, bleeding can also be a sign of something serious. Be sure to tell your health care provider about any vaginal bleeding right away. Some possible causes of vaginal bleeding during the first trimester include:  Infection or inflammation of the cervix.  Growths (polyps) on the cervix.  Miscarriage or threatened miscarriage.  Pregnancy tissue has developed outside of the uterus and in a fallopian tube (tubal pregnancy).  Tiny cysts have developed in the uterus instead of pregnancy tissue (molar pregnancy).  Follow these instructions at home: Watch your condition for any changes. The following actions may help to lessen any discomfort you are feeling:  Follow your health care provider's instructions for limiting your activity. If your health care provider orders bed rest,  you may need to stay in bed and only get up to use the bathroom. However, your health care provider may allow you to continue light activity.  If needed, make plans for someone to help with your regular activities and responsibilities while you are on bed rest.  Keep track of the number of pads you use each day, how often you change pads, and how soaked (saturated) they are. Write this down.  Do not use tampons. Do not douche.  Do not have sexual intercourse or orgasms until  approved by your health care provider.  If you pass any tissue from your vagina, save the tissue so you can show it to your health care provider.  Only take over-the-counter or prescription medicines as directed by your health care provider.  Do not take aspirin because it can make you bleed.  Keep all follow-up appointments as directed by your health care provider.  Contact a health care provider if:  You have any vaginal bleeding during any part of your pregnancy.  You have cramps or labor pains.  You have a fever, not controlled by medicine. Get help right away if:  You have severe cramps in your back or belly (abdomen).  You pass large clots or tissue from your vagina.  Your bleeding increases.  You feel light-headed or weak, or you have fainting episodes.  You have chills.  You are leaking fluid or have a gush of fluid from your vagina.  You pass out while having a bowel movement. This information is not intended to replace advice given to you by your health care provider. Make sure you discuss any questions you have with your health care provider. Document Released: 08/07/2005 Document Revised: 04/04/2016 Document Reviewed: 07/05/2013 Elsevier Interactive Patient Education  2018 [employer]THOPEDIST IF PAIN WORSENS

## 2018-06-14 NOTE — MAU Note (Signed)
In triage, pt reports she fell on her right hip. Also stated she had surgery on the right hip in June.
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
## 2018-06-15 LAB — GC/CHLAMYDIA PROBE AMP (~~LOC~~) NOT AT ARMC
Chlamydia: NEGATIVE
NEISSERIA GONORRHEA: NEGATIVE

## 2018-06-15 LAB — ABO/RH: ABO/RH(D): O POS

## 2018-06-15 LAB — HIV ANTIBODY (ROUTINE TESTING W REFLEX): HIV Screen 4th Generation wRfx: NONREACTIVE

## 2018-06-30 ENCOUNTER — Encounter (HOSPITAL_COMMUNITY): Payer: Self-pay

## 2018-06-30 ENCOUNTER — Inpatient Hospital Stay (HOSPITAL_COMMUNITY)
Admission: AD | Admit: 2018-06-30 | Discharge: 2018-06-30 | Disposition: A | Discharge status: Home / Self Care | Payer: Medicaid Other | Source: Ambulatory Visit | Attending: Obstetrics and Gynecology | Admitting: Obstetrics and Gynecology

## 2018-06-30 DIAGNOSIS — F17210 Nicotine dependence, cigarettes, uncomplicated: Secondary | ICD-10-CM | POA: Diagnosis not present

## 2018-06-30 DIAGNOSIS — O219 Vomiting of pregnancy, unspecified: Secondary | ICD-10-CM

## 2018-06-30 DIAGNOSIS — O210 Mild hyperemesis gravidarum: Secondary | ICD-10-CM | POA: Diagnosis not present

## 2018-06-30 DIAGNOSIS — O99331 Smoking (tobacco) complicating pregnancy, first trimester: Secondary | ICD-10-CM | POA: Insufficient documentation

## 2018-06-30 DIAGNOSIS — Z3A09 9 weeks gestation of pregnancy: Secondary | ICD-10-CM | POA: Insufficient documentation

## 2018-06-30 LAB — CBC
HCT: 35.6 % — ABNORMAL LOW (ref 36.0–46.0)
HEMOGLOBIN: 11.9 g/dL — AB (ref 12.0–15.0)
MCH: 27.9 pg (ref 26.0–34.0)
MCHC: 33.4 g/dL (ref 30.0–36.0)
MCV: 83.6 fL (ref 78.0–100.0)
Platelets: 266 10*3/uL (ref 150–400)
RBC: 4.26 MIL/uL (ref 3.87–5.11)
RDW: 12.9 % (ref 11.5–15.5)
WBC: 7.4 10*3/uL (ref 4.0–10.5)

## 2018-06-30 LAB — COMPREHENSIVE METABOLIC PANEL
ALBUMIN: 3.5 g/dL (ref 3.5–5.0)
ALK PHOS: 98 U/L (ref 38–126)
ALT: 26 U/L (ref 0–44)
AST: 27 U/L (ref 15–41)
Anion gap: 10 (ref 5–15)
BUN: 8 mg/dL (ref 6–20)
CALCIUM: 9.2 mg/dL (ref 8.9–10.3)
CO2: 22 mmol/L (ref 22–32)
Chloride: 99 mmol/L (ref 98–111)
Creatinine, Ser: 0.44 mg/dL (ref 0.44–1.00)
GFR calc Af Amer: >60 mL/min (ref >60)
GFR calc non Af Amer: >60 mL/min (ref >60)
Glucose, Bld: 87 mg/dL (ref 70–99)
Potassium: 4.1 mmol/L (ref 3.5–5.1)
Sodium: 131 mmol/L — ABNORMAL LOW (ref 135–145)
Total Bilirubin: 0.7 mg/dL (ref 0.3–1.2)
Total Protein: 7.1 g/dL (ref 6.5–8.1)

## 2018-06-30 LAB — URINALYSIS, ROUTINE W REFLEX MICROSCOPIC
Bilirubin Urine: NEGATIVE
GLUCOSE, UA: NEGATIVE mg/dL
Ketones, ur: 5 mg/dL — AB
Leukocytes, UA: NEGATIVE
Nitrite: NEGATIVE
Protein, ur: 30 mg/dL — AB
Specific Gravity, Urine: 1.023 (ref 1.005–1.030)
pH: 5 (ref 5.0–8.0)

## 2018-06-30 LAB — RAPID URINE DRUG SCREEN, HOSP PERFORMED
Amphetamines: NOT DETECTED
Barbiturates: NOT DETECTED
Benzodiazepines: NOT DETECTED
Cocaine: NOT DETECTED
Opiates: POSITIVE — AB
Tetrahydrocannabinol: POSITIVE — AB

## 2018-06-30 MED ORDER — SCOPOLAMINE 1 MG/3DAYS TD PT72: 1.0000 | MEDICATED_PATCH | TRANSDERMAL | 12 refills | Status: DC

## 2018-06-30 MED ORDER — SCOPOLAMINE 1 MG/3DAYS TD PT72
1.0000 | MEDICATED_PATCH | TRANSDERMAL | Status: DC
  Administered 2018-06-30: 1.5 mg via TRANSDERMAL
  Filled 2018-06-30: qty 1

## 2018-06-30 MED ORDER — METOCLOPRAMIDE HCL 10 MG PO TABS: 10.0000 mg | ORAL_TABLET | Freq: Four times a day (QID) | ORAL | 0 refills | Status: DC

## 2018-06-30 MED ORDER — LACTATED RINGERS IV BOLUS
1000.0000 mL | Freq: Once | INTRAVENOUS | Status: AC
  Administered 2018-06-30: 1000 mL via INTRAVENOUS

## 2018-06-30 MED ORDER — SODIUM CHLORIDE 0.9 % IV SOLN
8.0000 mg | Freq: Once | INTRAVENOUS | Status: AC
  Administered 2018-06-30: 8 mg via INTRAVENOUS
  Filled 2018-06-30: qty 4

## 2018-06-30 MED ORDER — METOCLOPRAMIDE HCL 5 MG/ML IJ SOLN
10.0000 mg | Freq: Once | INTRAMUSCULAR | Status: AC
  Administered 2018-06-30: 10 mg via INTRAVENOUS
  Filled 2018-06-30: qty 2
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
## 2018-06-30 NOTE — MAU Note (Signed)
Pt reports she continues to have severe nausea and vomiting even with phenergan and zofran. Also reports she continues to have a lot of pain from a pulled hamstring.

## 2018-06-30 NOTE — Discharge Instructions (Signed)
Hamstring Strain A hamstring strain is an injury that occurs when the hamstring muscles are overstretched or overloaded. The hamstring muscles are a group of muscles at the back of the thighs. These muscles are used in straightening the hips, bending the knees, and pulling back the legs. This type of injury is often called a pulled hamstring muscle. The severity of a muscle strain is rated in degrees. First-degree strains have the least amount of muscle fiber tearing and pain. Second-degree and third-degree strains have increasingly more tearing and pain. What are the causes? Hamstring strains occur when a sudden, violent force is placed on these muscles and stretches them too far. This often occurs during activities that involve running, jumping, kicking, or weight lifting. What increases the risk? Hamstring strains are especially common in athletes. Other things that can increase your risk for this injury include:  Having low strength, endurance, or flexibility of the hamstring muscles.  Performing high-impact physical activity.  Having poor physical fitness.  Having a previous leg injury.  Having fatigued muscles.  Older age.  What are the signs or symptoms?  Pain in the back of the thigh.  Bruising.  Swelling.  Muscle spasm.  Difficulty using the muscle because of pain or lack of normal function. For severe strains, you may have a popping or snapping feeling when the injury occurs. How is this diagnosed? Your health care provider will perform a physical exam and ask about your medical history. How is this treated? Often, the best treatment for a hamstring strain is protecting, resting, icing, applying compression, and elevating the injured area. This is referred to as the PRICE method of treatment. Your health care provider may also recommend medicines to help reduce pain or inflammation. Follow these instructions at home:  Use the PRICE method of treatment to promote muscle  healing during the first 2-3 days after your injury. The PRICE method involves: ? P--Protecting the muscle from being injured again. ? R--Restricting your activity and resting the injured body part. ? I--Icing your injury. To do this, put ice in a plastic bag. Place a towel between your skin and the bag. Then, apply the ice and leave it on for 20 minutes, 2-3 times per day. After the third day, switch to moist heat packs. ? C--Applying compression to the injured area with an elastic bandage. Be careful not to wrap it too tightly. That may interfere with blood circulation or may increase swelling. ? E--Elevating the injured body part above the level of your heart as often as you can. You can do this by putting a pillow under your thigh when you sit or lie down.  Take medicines only as directed by your health care provider.  Begin exercising or stretching as directed by your health care provider.  Do not return to full activity level until your health care provider approves.  Keep all follow-up visits as directed by your health care provider. This is important. Contact a health care provider if:  You have increasing pain or swelling in the injured area.  You have numbness, tingling, or a significant loss of strength in the injured area.  Your foot or your toes become cold or turn blue. This information is not intended to replace advice given to you by your health care provider. Make sure you discuss any questions you have with your health care provider. Document Released: 07/23/2001 Document Revised: 04/04/2016 Document Reviewed: 06/13/2014 Elsevier Interactive Patient Education  2018 [employer].

## 2018-07-28 ENCOUNTER — Ambulatory Visit: Payer: Medicaid Other | Admitting: Internal Medicine

## 2018-07-30 ENCOUNTER — Emergency Department (HOSPITAL_COMMUNITY)
Admission: EM | Admit: 2018-07-30 | Discharge: 2018-07-30 | Disposition: A | Discharge status: Home / Self Care | Payer: Medicaid Other | Attending: Emergency Medicine | Admitting: Emergency Medicine

## 2018-07-30 ENCOUNTER — Encounter (HOSPITAL_COMMUNITY): Payer: Self-pay | Admitting: Emergency Medicine

## 2018-07-30 DIAGNOSIS — Z86718 Personal history of other venous thrombosis and embolism: Secondary | ICD-10-CM | POA: Insufficient documentation

## 2018-07-30 DIAGNOSIS — Z79899 Other long term (current) drug therapy: Secondary | ICD-10-CM | POA: Diagnosis not present

## 2018-07-30 DIAGNOSIS — O9989 Other specified diseases and conditions complicating pregnancy, childbirth and the puerperium: Secondary | ICD-10-CM | POA: Diagnosis not present

## 2018-07-30 DIAGNOSIS — O99281 Endocrine, nutritional and metabolic diseases complicating pregnancy, first trimester: Secondary | ICD-10-CM | POA: Insufficient documentation

## 2018-07-30 DIAGNOSIS — M25551 Pain in right hip: Secondary | ICD-10-CM | POA: Diagnosis not present

## 2018-07-30 DIAGNOSIS — O99331 Smoking (tobacco) complicating pregnancy, first trimester: Secondary | ICD-10-CM | POA: Insufficient documentation

## 2018-07-30 DIAGNOSIS — F17210 Nicotine dependence, cigarettes, uncomplicated: Secondary | ICD-10-CM | POA: Insufficient documentation

## 2018-07-30 DIAGNOSIS — E039 Hypothyroidism, unspecified: Secondary | ICD-10-CM | POA: Insufficient documentation

## 2018-07-30 DIAGNOSIS — Z3A12 12 weeks gestation of pregnancy: Secondary | ICD-10-CM | POA: Insufficient documentation

## 2018-07-30 LAB — CBC WITH DIFFERENTIAL/PLATELET
BASOS PCT: 0 %
Basophils Absolute: 0 10*3/uL (ref 0.0–0.1)
EOS ABS: 0 10*3/uL (ref 0.0–0.7)
EOS PCT: 0 %
HCT: 35.6 % — ABNORMAL LOW (ref 36.0–46.0)
HEMOGLOBIN: 11.9 g/dL — AB (ref 12.0–15.0)
LYMPHS ABS: 1.5 10*3/uL (ref 0.7–4.0)
Lymphocytes Relative: 20 %
MCH: 27.7 pg (ref 26.0–34.0)
MCHC: 33.4 g/dL (ref 30.0–36.0)
MCV: 83 fL (ref 78.0–100.0)
MONO ABS: 0.3 10*3/uL (ref 0.1–1.0)
MONOS PCT: 4 %
NEUTROS PCT: 76 %
Neutro Abs: 5.7 10*3/uL (ref 1.7–7.7)
Platelets: 191 10*3/uL (ref 150–400)
RBC: 4.29 MIL/uL (ref 3.87–5.11)
RDW: 14.3 % (ref 11.5–15.5)
WBC: 7.6 10*3/uL (ref 4.0–10.5)

## 2018-07-30 LAB — COMPREHENSIVE METABOLIC PANEL
ALBUMIN: 3.4 g/dL — AB (ref 3.5–5.0)
ALK PHOS: 90 U/L (ref 38–126)
ALT: 91 U/L — AB (ref 0–44)
ANION GAP: 9 (ref 5–15)
AST: 54 U/L — ABNORMAL HIGH (ref 15–41)
BILIRUBIN TOTAL: 0.5 mg/dL (ref 0.3–1.2)
CALCIUM: 9.2 mg/dL (ref 8.9–10.3)
CO2: 24 mmol/L (ref 22–32)
CREATININE: 0.45 mg/dL (ref 0.44–1.00)
Chloride: 104 mmol/L (ref 98–111)
GFR calc Af Amer: >60 mL/min (ref >60)
GFR calc non Af Amer: >60 mL/min (ref >60)
GLUCOSE: 91 mg/dL (ref 70–99)
Potassium: 3.7 mmol/L (ref 3.5–5.1)
SODIUM: 137 mmol/L (ref 135–145)
TOTAL PROTEIN: 7.3 g/dL (ref 6.5–8.1)

## 2018-07-30 LAB — PROTIME-INR
INR: 0.9
PROTHROMBIN TIME: 12 s (ref 11.4–15.2)

## 2018-07-30 LAB — I-STAT BETA HCG BLOOD, ED (MC, WL, AP ONLY): I-stat hCG, quantitative: >2000 m[IU]/mL — ABNORMAL HIGH (ref <5)

## 2018-07-30 LAB — I-STAT CG4 LACTIC ACID, ED: Lactic Acid, Venous: 1.05 mmol/L (ref 0.5–1.9)

## 2018-07-30 MED ORDER — ACETAMINOPHEN 650 MG RE SUPP
650.0000 mg | Freq: Once | RECTAL | Status: AC
  Administered 2018-07-30: 650 mg via RECTAL
  Filled 2018-07-30: qty 1

## 2018-07-30 MED ORDER — SODIUM CHLORIDE 0.9 % IV BOLUS
1000.0000 mL | Freq: Once | INTRAVENOUS | Status: AC
Start: 2018-07-30 — End: 2018-07-30
  Administered 2018-07-30: 1000 mL via INTRAVENOUS

## 2018-07-30 MED ORDER — ONDANSETRON HCL 4 MG/2ML IJ SOLN
4.0000 mg | Freq: Once | INTRAMUSCULAR | Status: DC
  Filled 2018-07-30: qty 2

## 2018-07-30 MED ORDER — ONDANSETRON 8 MG PO TBDP
8.0000 mg | ORAL_TABLET | Freq: Once | ORAL | Status: AC
  Administered 2018-07-30: 8 mg via ORAL
  Filled 2018-07-30: qty 1

## 2018-07-30 MED ORDER — ACETAMINOPHEN 650 MG RE SUPP: 650.0000 mg | RECTAL | 0 refills | Status: AC | PRN
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
## 2018-07-30 NOTE — ED Notes (Signed)
Patient reported pain at her IV site. PIV removed and IVF stopped. EDMD notified. Patient requesting pain medication.

## 2018-07-30 NOTE — ED Triage Notes (Addendum)
Per [facility] pt from home for right hip pain and septic and needing her antibiotics IV due to not being able to keep the oral antibiotics down due to vomiting and being pregnant.pt reports since she has past IV drug addiction, no one will let her leave the hospital with PICC line for IV antibiotics, like she needs.  Pt was hospitalized in [facility] for week for hip fracture and sepsis in it. Was supposed to follow up with infectious Disease across from [facility] Tuesday but missed her appt.   Pt is [redacted] weeks pregnant. Vitals: 122/68, 68HR, 18R, CBG 84.

## 2018-07-30 NOTE — Discharge Instructions (Addendum)
As discussed, your evaluation today has been largely reassuring.  But, it is important that you monitor your condition carefully, and do not hesitate to return to the ED if you develop new, or concerning changes in your condition. ° °Otherwise, please follow-up with your physicians for appropriate ongoing care. ° °

## 2018-08-04 LAB — CULTURE, BLOOD (ROUTINE X 2)
CULTURE: NO GROWTH
Culture: NO GROWTH
SPECIAL REQUESTS: ADEQUATE
Special Requests: ADEQUATE

## 2018-08-11 ENCOUNTER — Other Ambulatory Visit: Payer: Self-pay

## 2018-08-11 ENCOUNTER — Encounter (HOSPITAL_COMMUNITY): Payer: Self-pay | Admitting: *Deleted

## 2018-08-11 ENCOUNTER — Inpatient Hospital Stay (HOSPITAL_COMMUNITY)
Admission: AD | Admit: 2018-08-11 | Discharge: 2018-08-11 | Disposition: A | Discharge status: Home / Self Care | Payer: Medicaid Other | Source: Ambulatory Visit | Attending: Family Medicine | Admitting: Family Medicine

## 2018-08-11 DIAGNOSIS — F17210 Nicotine dependence, cigarettes, uncomplicated: Secondary | ICD-10-CM | POA: Diagnosis not present

## 2018-08-11 DIAGNOSIS — Z3A15 15 weeks gestation of pregnancy: Secondary | ICD-10-CM | POA: Insufficient documentation

## 2018-08-11 DIAGNOSIS — M25551 Pain in right hip: Secondary | ICD-10-CM

## 2018-08-11 DIAGNOSIS — O26892 Other specified pregnancy related conditions, second trimester: Secondary | ICD-10-CM | POA: Diagnosis not present

## 2018-08-11 DIAGNOSIS — O99332 Smoking (tobacco) complicating pregnancy, second trimester: Secondary | ICD-10-CM | POA: Insufficient documentation

## 2018-08-11 MED ORDER — OXYCODONE-ACETAMINOPHEN 5-325 MG PO TABS
1.0000 | ORAL_TABLET | Freq: Once | ORAL | Status: AC
  Administered 2018-08-11: 1 via ORAL
  Filled 2018-08-11: qty 1

## 2018-08-11 MED ORDER — OXYCODONE-ACETAMINOPHEN 5-325 MG PO TABS: 1.0000 | ORAL_TABLET | Freq: Four times a day (QID) | ORAL | 0 refills | Status: DC | PRN
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
## 2018-08-11 NOTE — Discharge Instructions (Signed)
Second Trimester of Pregnancy The second trimester is from week 13 through week 28, month 4 through 6. This is often the time in pregnancy that you feel your best. Often times, morning sickness has lessened or quit. You may have more energy, and you may get hungry more often. Your unborn baby (fetus) is growing rapidly. At the end of the sixth month, he or she is about 9 inches long and weighs about 1 pounds. You will likely feel the baby move (quickening) between 18 and 20 weeks of pregnancy. Follow these instructions at home:  Avoid all smoking, herbs, and alcohol. Avoid drugs not approved by your doctor.  Do not use any tobacco products, including cigarettes, chewing tobacco, and electronic cigarettes. If you need help quitting, ask your doctor. You may get counseling or other support to help you quit.  Only take medicine as told by your doctor. Some medicines are safe and some are not during pregnancy.  Exercise only as told by your doctor. Stop exercising if you start having cramps.  Eat regular, healthy meals.  Wear a good support bra if your breasts are tender.  Do not use hot tubs, steam rooms, or saunas.  Wear your seat belt when driving.  Avoid raw meat, uncooked cheese, and liter boxes and soil used by cats.  Take your prenatal vitamins.  Take 1500-2000 milligrams of calcium daily starting at the 20th week of pregnancy until you deliver your baby.  Try taking medicine that helps you poop (stool softener) as needed, and if your doctor approves. Eat more fiber by eating fresh fruit, vegetables, and whole grains. Drink enough fluids to keep your pee (urine) clear or pale yellow.  Take warm water baths (sitz baths) to soothe pain or discomfort caused by hemorrhoids. Use hemorrhoid cream if your doctor approves.  If you have puffy, bulging veins (varicose veins), wear support hose. Raise (elevate) your feet for 15 minutes, 3-4 times a day. Limit salt in your diet.  Avoid heavy  lifting, wear low heals, and sit up straight.  Rest with your legs raised if you have leg cramps or low back pain.  Visit your dentist if you have not gone during your pregnancy. Use a soft toothbrush to brush your teeth. Be gentle when you floss.  You can have sex (intercourse) unless your doctor tells you not to.  Go to your doctor visits. Get help if:  You feel dizzy.  You have mild cramps or pressure in your lower belly (abdomen).  You have a nagging pain in your belly area.  You continue to feel sick to your stomach (nauseous), throw up (vomit), or have watery poop (diarrhea).  You have bad smelling fluid coming from your vagina.  You have pain with peeing (urination). Get help right away if:  You have a fever.  You are leaking fluid from your vagina.  You have spotting or bleeding from your vagina.  You have severe belly cramping or pain.  You lose or gain weight rapidly.  You have trouble catching your breath and have chest pain.  You notice sudden or extreme puffiness (swelling) of your face, hands, ankles, feet, or legs.  You have not felt the baby move in over an hour.  You have severe headaches that do not go away with medicine.  You have vision changes. This information is not intended to replace advice given to you by your health care provider. Make sure you discuss any questions you have with your health care   provider. Document Released: 01/22/2010 Document Revised: 04/04/2016 Document Reviewed: 12/29/2012 Elsevier Interactive Patient Education  2017 Elsevier Inc.  

## 2018-08-11 NOTE — MAU Note (Signed)
Pt presents to MAU with complaints of right hip pain. Pt states she came here to make an appointment in the clinic and they told her to come to MAU and be evaluated. Denies any VB or abdominal pain

## 2018-08-17 ENCOUNTER — Encounter: Payer: Self-pay | Admitting: *Deleted

## 2018-08-17 ENCOUNTER — Encounter (HOSPITAL_COMMUNITY): Payer: Self-pay

## 2018-08-17 ENCOUNTER — Encounter: Payer: Medicaid Other | Admitting: Obstetrics & Gynecology

## 2018-08-17 ENCOUNTER — Ambulatory Visit (INDEPENDENT_AMBULATORY_CARE_PROVIDER_SITE_OTHER): Payer: Medicaid Other | Admitting: Obstetrics & Gynecology

## 2018-08-17 ENCOUNTER — Inpatient Hospital Stay (HOSPITAL_COMMUNITY)
Admission: EM | Admit: 2018-08-17 | Discharge: 2018-08-18 | DRG: 832 | Discharge status: Against Medical Advice | Payer: Medicaid Other | Attending: Internal Medicine | Admitting: Internal Medicine

## 2018-08-17 ENCOUNTER — Other Ambulatory Visit: Payer: Self-pay

## 2018-08-17 ENCOUNTER — Encounter: Payer: Self-pay | Admitting: Obstetrics & Gynecology

## 2018-08-17 ENCOUNTER — Ambulatory Visit: Payer: Medicaid Other | Admitting: Clinical

## 2018-08-17 VITALS — BP 96/48 | HR 75 | Wt 163.4 lb

## 2018-08-17 DIAGNOSIS — O0992 Supervision of high risk pregnancy, unspecified, second trimester: Secondary | ICD-10-CM

## 2018-08-17 DIAGNOSIS — F319 Bipolar disorder, unspecified: Secondary | ICD-10-CM | POA: Diagnosis present

## 2018-08-17 DIAGNOSIS — B1920 Unspecified viral hepatitis C without hepatic coma: Secondary | ICD-10-CM | POA: Diagnosis present

## 2018-08-17 DIAGNOSIS — F419 Anxiety disorder, unspecified: Secondary | ICD-10-CM | POA: Diagnosis present

## 2018-08-17 DIAGNOSIS — K219 Gastro-esophageal reflux disease without esophagitis: Secondary | ICD-10-CM | POA: Diagnosis present

## 2018-08-17 DIAGNOSIS — M069 Rheumatoid arthritis, unspecified: Secondary | ICD-10-CM | POA: Diagnosis present

## 2018-08-17 DIAGNOSIS — O99712 Diseases of the skin and subcutaneous tissue complicating pregnancy, second trimester: Principal | ICD-10-CM | POA: Diagnosis present

## 2018-08-17 DIAGNOSIS — W182XXD Fall in (into) shower or empty bathtub, subsequent encounter: Secondary | ICD-10-CM | POA: Diagnosis not present

## 2018-08-17 DIAGNOSIS — Z91048 Other nonmedicinal substance allergy status: Secondary | ICD-10-CM

## 2018-08-17 DIAGNOSIS — S76309D Unspecified injury of muscle, fascia and tendon of the posterior muscle group at thigh level, unspecified thigh, subsequent encounter: Secondary | ICD-10-CM

## 2018-08-17 DIAGNOSIS — M869 Osteomyelitis, unspecified: Secondary | ICD-10-CM | POA: Diagnosis not present

## 2018-08-17 DIAGNOSIS — O99012 Anemia complicating pregnancy, second trimester: Secondary | ICD-10-CM | POA: Diagnosis present

## 2018-08-17 DIAGNOSIS — S76811D Strain of other specified muscles, fascia and tendons at thigh level, right thigh, subsequent encounter: Secondary | ICD-10-CM

## 2018-08-17 DIAGNOSIS — Z79899 Other long term (current) drug therapy: Secondary | ICD-10-CM

## 2018-08-17 DIAGNOSIS — Z331 Pregnant state, incidental: Secondary | ICD-10-CM | POA: Diagnosis not present

## 2018-08-17 DIAGNOSIS — Z3A16 16 weeks gestation of pregnancy: Secondary | ICD-10-CM

## 2018-08-17 DIAGNOSIS — E039 Hypothyroidism, unspecified: Secondary | ICD-10-CM | POA: Diagnosis present

## 2018-08-17 DIAGNOSIS — F4310 Post-traumatic stress disorder, unspecified: Secondary | ICD-10-CM | POA: Diagnosis present

## 2018-08-17 DIAGNOSIS — B182 Chronic viral hepatitis C: Secondary | ICD-10-CM

## 2018-08-17 DIAGNOSIS — O99342 Other mental disorders complicating pregnancy, second trimester: Secondary | ICD-10-CM | POA: Diagnosis present

## 2018-08-17 DIAGNOSIS — R740 Nonspecific elevation of levels of transaminase and lactic acid dehydrogenase [LDH]: Secondary | ICD-10-CM | POA: Diagnosis present

## 2018-08-17 DIAGNOSIS — D649 Anemia, unspecified: Secondary | ICD-10-CM | POA: Diagnosis present

## 2018-08-17 DIAGNOSIS — K08409 Partial loss of teeth, unspecified cause, unspecified class: Secondary | ICD-10-CM | POA: Diagnosis not present

## 2018-08-17 DIAGNOSIS — L0231 Cutaneous abscess of buttock: Secondary | ICD-10-CM | POA: Diagnosis present

## 2018-08-17 DIAGNOSIS — O99512 Diseases of the respiratory system complicating pregnancy, second trimester: Secondary | ICD-10-CM | POA: Diagnosis present

## 2018-08-17 DIAGNOSIS — Z85820 Personal history of malignant melanoma of skin: Secondary | ICD-10-CM

## 2018-08-17 DIAGNOSIS — O99612 Diseases of the digestive system complicating pregnancy, second trimester: Secondary | ICD-10-CM | POA: Diagnosis present

## 2018-08-17 DIAGNOSIS — R112 Nausea with vomiting, unspecified: Secondary | ICD-10-CM | POA: Diagnosis not present

## 2018-08-17 DIAGNOSIS — Z86718 Personal history of other venous thrombosis and embolism: Secondary | ICD-10-CM

## 2018-08-17 DIAGNOSIS — O99282 Endocrine, nutritional and metabolic diseases complicating pregnancy, second trimester: Secondary | ICD-10-CM | POA: Diagnosis present

## 2018-08-17 DIAGNOSIS — O99332 Smoking (tobacco) complicating pregnancy, second trimester: Secondary | ICD-10-CM | POA: Diagnosis present

## 2018-08-17 DIAGNOSIS — F329 Major depressive disorder, single episode, unspecified: Secondary | ICD-10-CM

## 2018-08-17 DIAGNOSIS — Z7951 Long term (current) use of inhaled steroids: Secondary | ICD-10-CM

## 2018-08-17 DIAGNOSIS — M25551 Pain in right hip: Secondary | ICD-10-CM | POA: Diagnosis not present

## 2018-08-17 DIAGNOSIS — F17210 Nicotine dependence, cigarettes, uncomplicated: Secondary | ICD-10-CM | POA: Diagnosis present

## 2018-08-17 DIAGNOSIS — J45909 Unspecified asthma, uncomplicated: Secondary | ICD-10-CM

## 2018-08-17 DIAGNOSIS — Z8541 Personal history of malignant neoplasm of cervix uteri: Secondary | ICD-10-CM

## 2018-08-17 DIAGNOSIS — Z881 Allergy status to other antibiotic agents status: Secondary | ICD-10-CM

## 2018-08-17 DIAGNOSIS — Z9049 Acquired absence of other specified parts of digestive tract: Secondary | ICD-10-CM

## 2018-08-17 DIAGNOSIS — E876 Hypokalemia: Secondary | ICD-10-CM

## 2018-08-17 DIAGNOSIS — Z3490 Encounter for supervision of normal pregnancy, unspecified, unspecified trimester: Secondary | ICD-10-CM

## 2018-08-17 DIAGNOSIS — F32A Depression, unspecified: Secondary | ICD-10-CM

## 2018-08-17 DIAGNOSIS — Z8614 Personal history of Methicillin resistant Staphylococcus aureus infection: Secondary | ICD-10-CM

## 2018-08-17 DIAGNOSIS — K0889 Other specified disorders of teeth and supporting structures: Secondary | ICD-10-CM | POA: Diagnosis not present

## 2018-08-17 DIAGNOSIS — O219 Vomiting of pregnancy, unspecified: Secondary | ICD-10-CM | POA: Diagnosis present

## 2018-08-17 DIAGNOSIS — S76319A Strain of muscle, fascia and tendon of the posterior muscle group at thigh level, unspecified thigh, initial encounter: Secondary | ICD-10-CM

## 2018-08-17 DIAGNOSIS — Z8249 Family history of ischemic heart disease and other diseases of the circulatory system: Secondary | ICD-10-CM

## 2018-08-17 DIAGNOSIS — M797 Fibromyalgia: Secondary | ICD-10-CM | POA: Diagnosis present

## 2018-08-17 DIAGNOSIS — Z8349 Family history of other endocrine, nutritional and metabolic diseases: Secondary | ICD-10-CM

## 2018-08-17 DIAGNOSIS — R7989 Other specified abnormal findings of blood chemistry: Secondary | ICD-10-CM | POA: Diagnosis not present

## 2018-08-17 DIAGNOSIS — F1910 Other psychoactive substance abuse, uncomplicated: Secondary | ICD-10-CM

## 2018-08-17 DIAGNOSIS — M868X5 Other osteomyelitis, thigh: Secondary | ICD-10-CM | POA: Diagnosis present

## 2018-08-17 DIAGNOSIS — Z87440 Personal history of urinary (tract) infections: Secondary | ICD-10-CM

## 2018-08-17 DIAGNOSIS — E871 Hypo-osmolality and hyponatremia: Secondary | ICD-10-CM | POA: Diagnosis present

## 2018-08-17 DIAGNOSIS — O99340 Other mental disorders complicating pregnancy, unspecified trimester: Secondary | ICD-10-CM

## 2018-08-17 DIAGNOSIS — Z91018 Allergy to other foods: Secondary | ICD-10-CM

## 2018-08-17 DIAGNOSIS — R7401 Elevation of levels of liver transaminase levels: Secondary | ICD-10-CM

## 2018-08-17 DIAGNOSIS — O0990 Supervision of high risk pregnancy, unspecified, unspecified trimester: Secondary | ICD-10-CM | POA: Insufficient documentation

## 2018-08-17 LAB — COMPREHENSIVE METABOLIC PANEL
ALT: 75 U/L — AB (ref 0–44)
AST: 75 U/L — AB (ref 15–41)
Albumin: 2.8 g/dL — ABNORMAL LOW (ref 3.5–5.0)
Alkaline Phosphatase: 115 U/L (ref 38–126)
Anion gap: 11 (ref 5–15)
CHLORIDE: 93 mmol/L — AB (ref 98–111)
CO2: 25 mmol/L (ref 22–32)
CREATININE: 0.45 mg/dL (ref 0.44–1.00)
Calcium: 8.6 mg/dL — ABNORMAL LOW (ref 8.9–10.3)
GFR calc Af Amer: >60 mL/min (ref >60)
Glucose, Bld: 93 mg/dL (ref 70–99)
Potassium: 2.9 mmol/L — ABNORMAL LOW (ref 3.5–5.1)
Sodium: 129 mmol/L — ABNORMAL LOW (ref 135–145)
Total Bilirubin: 0.9 mg/dL (ref 0.3–1.2)
Total Protein: 6.9 g/dL (ref 6.5–8.1)

## 2018-08-17 LAB — CBC WITH DIFFERENTIAL/PLATELET
Abs Immature Granulocytes: 0 10*3/uL (ref 0.0–0.1)
Basophils Absolute: 0 10*3/uL (ref 0.0–0.1)
Basophils Relative: 0 %
EOS PCT: 1 %
Eosinophils Absolute: 0.1 10*3/uL (ref 0.0–0.7)
HEMATOCRIT: 32.1 % — AB (ref 36.0–46.0)
Hemoglobin: 10.2 g/dL — ABNORMAL LOW (ref 12.0–15.0)
Immature Granulocytes: 0 %
LYMPHS ABS: 1.4 10*3/uL (ref 0.7–4.0)
Lymphocytes Relative: 16 %
MCH: 27.1 pg (ref 26.0–34.0)
MCHC: 31.8 g/dL (ref 30.0–36.0)
MCV: 85.1 fL (ref 78.0–100.0)
MONO ABS: 0.6 10*3/uL (ref 0.1–1.0)
MONOS PCT: 7 %
Neutro Abs: 6.8 10*3/uL (ref 1.7–7.7)
Neutrophils Relative %: 76 %
Platelets: 270 10*3/uL (ref 150–400)
RBC: 3.77 MIL/uL — ABNORMAL LOW (ref 3.87–5.11)
RDW: 14.2 % (ref 11.5–15.5)
WBC: 9 10*3/uL (ref 4.0–10.5)

## 2018-08-17 LAB — RAPID URINE DRUG SCREEN, HOSP PERFORMED
AMPHETAMINES: NOT DETECTED
BARBITURATES: NOT DETECTED
Benzodiazepines: NOT DETECTED
Cocaine: NOT DETECTED
Opiates: POSITIVE — AB
Tetrahydrocannabinol: POSITIVE — AB

## 2018-08-17 LAB — I-STAT BETA HCG BLOOD, ED (MC, WL, AP ONLY): I-stat hCG, quantitative: >2000 m[IU]/mL — ABNORMAL HIGH (ref <5)

## 2018-08-17 LAB — I-STAT CHEM 8, ED
CALCIUM ION: 1.09 mmol/L — AB (ref 1.15–1.40)
CHLORIDE: 93 mmol/L — AB (ref 98–111)
CREATININE: 0.4 mg/dL — AB (ref 0.44–1.00)
GLUCOSE: 93 mg/dL (ref 70–99)
HCT: 31 % — ABNORMAL LOW (ref 36.0–46.0)
Hemoglobin: 10.5 g/dL — ABNORMAL LOW (ref 12.0–15.0)
Potassium: 3 mmol/L — ABNORMAL LOW (ref 3.5–5.1)
Sodium: 130 mmol/L — ABNORMAL LOW (ref 135–145)
TCO2: 26 mmol/L (ref 22–32)

## 2018-08-17 LAB — POCT URINALYSIS DIP (DEVICE)
BILIRUBIN URINE: NEGATIVE
Glucose, UA: NEGATIVE mg/dL
HGB URINE DIPSTICK: NEGATIVE
Ketones, ur: NEGATIVE mg/dL
Leukocytes, UA: NEGATIVE
Nitrite: NEGATIVE
Protein, ur: NEGATIVE mg/dL
SPECIFIC GRAVITY, URINE: 1.015 (ref 1.005–1.030)
Urobilinogen, UA: 2 mg/dL — ABNORMAL HIGH (ref 0.0–1.0)
pH: 7 (ref 5.0–8.0)

## 2018-08-17 MED ORDER — PANTOPRAZOLE SODIUM 20 MG PO TBEC
20.00 | DELAYED_RELEASE_TABLET | ORAL | Status: DC
Start: 2018-08-17 — End: 2018-08-17

## 2018-08-17 MED ORDER — ACETAMINOPHEN 325 MG PO TABS
650.00 | ORAL_TABLET | ORAL | Status: DC
Start: ? — End: 2018-08-17

## 2018-08-17 MED ORDER — PROMETHAZINE HCL 25 MG/ML IJ SOLN
12.50 | INTRAMUSCULAR | Status: DC
Start: ? — End: 2018-08-17

## 2018-08-17 MED ORDER — SODIUM CHLORIDE 0.9 % IV SOLN
INTRAVENOUS | Status: AC
  Administered 2018-08-17: via INTRAVENOUS

## 2018-08-17 MED ORDER — GENERIC EXTERNAL MEDICATION
10.00 | Status: DC
Start: ? — End: 2018-08-17

## 2018-08-17 MED ORDER — POLYETHYLENE GLYCOL 3350 17 G PO PACK
17.00 | PACK | ORAL | Status: DC
Start: ? — End: 2018-08-17

## 2018-08-17 MED ORDER — SODIUM CHLORIDE 0.9 % IV SOLN
10.00 | INTRAVENOUS | Status: DC
Start: ? — End: 2018-08-17

## 2018-08-17 MED ORDER — HYDROXYZINE HCL 25 MG PO TABS
25.00 | ORAL_TABLET | ORAL | Status: DC
Start: ? — End: 2018-08-17

## 2018-08-17 MED ORDER — FENTANYL CITRATE (PF) 100 MCG/2ML IJ SOLN
12.5000 ug | Freq: Once | INTRAMUSCULAR | Status: AC
  Administered 2018-08-17: 12.5 ug via INTRAVENOUS
  Filled 2018-08-17: qty 2

## 2018-08-17 MED ORDER — KETOROLAC TROMETHAMINE 15 MG/ML IJ SOLN
15.0000 mg | Freq: Four times a day (QID) | INTRAMUSCULAR | Status: DC | PRN
  Administered 2018-08-17 – 2018-08-18 (×3): 15 mg via INTRAVENOUS
  Filled 2018-08-17 (×3): qty 1

## 2018-08-17 MED ORDER — GENERIC EXTERNAL MEDICATION
Status: DC
Start: ? — End: 2018-08-17

## 2018-08-17 MED ORDER — MOMETASONE FURO-FORMOTEROL FUM 200-5 MCG/ACT IN AERO
2.0000 | INHALATION_SPRAY | Freq: Two times a day (BID) | RESPIRATORY_TRACT | Status: DC
  Filled 2018-08-17: qty 8.8

## 2018-08-17 MED ORDER — ONDANSETRON HCL 4 MG/2ML IJ SOLN
4.0000 mg | Freq: Three times a day (TID) | INTRAMUSCULAR | Status: DC | PRN
  Administered 2018-08-18: 4 mg via INTRAVENOUS
  Filled 2018-08-17: qty 2

## 2018-08-17 MED ORDER — ENOXAPARIN SODIUM 40 MG/0.4ML ~~LOC~~ SOLN
40.0000 mg | Freq: Every day | SUBCUTANEOUS | Status: DC
  Filled 2018-08-17: qty 0.4

## 2018-08-17 MED ORDER — POTASSIUM CHLORIDE 10 MEQ/100ML IV SOLN
10.0000 meq | INTRAVENOUS | Status: AC
  Administered 2018-08-17 – 2018-08-18 (×5): 10 meq via INTRAVENOUS
  Filled 2018-08-17 (×6): qty 100

## 2018-08-17 MED ORDER — ALBUTEROL SULFATE (2.5 MG/3ML) 0.083% IN NEBU: 2.5000 mL | INHALATION_SOLUTION | RESPIRATORY_TRACT | Status: DC | PRN

## 2018-08-17 MED ORDER — GENERIC EXTERNAL MEDICATION
1.50 | Status: DC
Start: 2018-08-16 — End: 2018-08-17

## 2018-08-17 MED ORDER — PANTOPRAZOLE SODIUM 40 MG PO TBEC
40.0000 mg | DELAYED_RELEASE_TABLET | Freq: Every day | ORAL | Status: DC
  Filled 2018-08-17: qty 1

## 2018-08-17 MED ORDER — NITROGLYCERIN 0.4 MG SL SUBL
0.40 | SUBLINGUAL_TABLET | SUBLINGUAL | Status: DC
Start: ? — End: 2018-08-17

## 2018-08-17 MED ORDER — ALUM & MAG HYDROXIDE-SIMETH 200-200-20 MG/5ML PO SUSP
30.00 | ORAL | Status: DC
Start: ? — End: 2018-08-17

## 2018-08-17 MED ORDER — NITROFURANTOIN MONOHYD MACRO 100 MG PO CAPS
100.00 | ORAL_CAPSULE | ORAL | Status: DC
Start: 2018-08-16 — End: 2018-08-17

## 2018-08-17 MED ORDER — KETOROLAC TROMETHAMINE 15 MG/ML IJ SOLN
15.00 | INTRAMUSCULAR | Status: DC
Start: ? — End: 2018-08-17

## 2018-08-17 NOTE — Progress Notes (Signed)
Here for new ob visit. Given new patient information. Declines MyChart. Signed up for [employer] app only. Declines flu shot. Referral to [facility] initiated due to elevated phq9 today and hx depression. Also referral to orthopedics per order for torn hamsting. Patient in a lot of pain . Release of information signed for pap result. PMH completed.

## 2018-08-17 NOTE — Progress Notes (Signed)
Per Dr.Arnold needs to go to ED to get treatment for osteomyelitis. Patient states someone dropped her off and will not come get her and take to ED. Called [facility] to see if they can get her a taxi voucher to ED.

## 2018-08-17 NOTE — ED Triage Notes (Addendum)
Pt here for right hip fx and hip infection and torn hamstring. Pt has been hospitalized twice at [facility] and then went to [facility] once as well. Pt was told that "no one would do surgery on my hamstring/hip until I see my OB/gyn" Pt is [redacted] wks pregnant and Pt made it to ob/gyn appt today and was told to come back to hospital to be admitted for IV antibiotics and surgery. VSS.
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
## 2018-08-17 NOTE — Care Management (Signed)
76 CM received call from RN in the clinic requesting a taxi cab voucher for patient for ride to [facility] to be evaluated for orthopedic issue.  Per RN patient did not have a ride and could not walk to catch a bus but was stable to ride a taxi to [place] [employer] called and voucher given to Geralyn Flash for patient.

## 2018-08-17 NOTE — Patient Instructions (Signed)
Second Trimester of Pregnancy The second trimester is from week 13 through week 28, month 4 through 6. This is often the time in pregnancy that you feel your best. Often times, morning sickness has lessened or quit. You may have more energy, and you may get hungry more often. Your unborn baby (fetus) is growing rapidly. At the end of the sixth month, he or she is about 9 inches long and weighs about 1 pounds. You will likely feel the baby move (quickening) between 18 and 20 weeks of pregnancy. Follow these instructions at home:  Avoid all smoking, herbs, and alcohol. Avoid drugs not approved by your doctor.  Do not use any tobacco products, including cigarettes, chewing tobacco, and electronic cigarettes. If you need help quitting, ask your doctor. You may get counseling or other support to help you quit.  Only take medicine as told by your doctor. Some medicines are safe and some are not during pregnancy.  Exercise only as told by your doctor. Stop exercising if you start having cramps.  Eat regular, healthy meals.  Wear a good support bra if your breasts are tender.  Do not use hot tubs, steam rooms, or saunas.  Wear your seat belt when driving.  Avoid raw meat, uncooked cheese, and liter boxes and soil used by cats.  Take your prenatal vitamins.  Take 1500-2000 milligrams of calcium daily starting at the 20th week of pregnancy until you deliver your baby.  Try taking medicine that helps you poop (stool softener) as needed, and if your doctor approves. Eat more fiber by eating fresh fruit, vegetables, and whole grains. Drink enough fluids to keep your pee (urine) clear or pale yellow.  Take warm water baths (sitz baths) to soothe pain or discomfort caused by hemorrhoids. Use hemorrhoid cream if your doctor approves.  If you have puffy, bulging veins (varicose veins), wear support hose. Raise (elevate) your feet for 15 minutes, 3-4 times a day. Limit salt in your diet.  Avoid heavy  lifting, wear low heals, and sit up straight.  Rest with your legs raised if you have leg cramps or low back pain.  Visit your dentist if you have not gone during your pregnancy. Use a soft toothbrush to brush your teeth. Be gentle when you floss.  You can have sex (intercourse) unless your doctor tells you not to.  Go to your doctor visits. Get help if:  You feel dizzy.  You have mild cramps or pressure in your lower belly (abdomen).  You have a nagging pain in your belly area.  You continue to feel sick to your stomach (nauseous), throw up (vomit), or have watery poop (diarrhea).  You have bad smelling fluid coming from your vagina.  You have pain with peeing (urination). Get help right away if:  You have a fever.  You are leaking fluid from your vagina.  You have spotting or bleeding from your vagina.  You have severe belly cramping or pain.  You lose or gain weight rapidly.  You have trouble catching your breath and have chest pain.  You notice sudden or extreme puffiness (swelling) of your face, hands, ankles, feet, or legs.  You have not felt the baby move in over an hour.  You have severe headaches that do not go away with medicine.  You have vision changes. This information is not intended to replace advice given to you by your health care provider. Make sure you discuss any questions you have with your health care   provider. Document Released: 01/22/2010 Document Revised: 04/04/2016 Document Reviewed: 12/29/2012 Elsevier Interactive Patient Education  2017 Elsevier Inc.  

## 2018-08-17 NOTE — BH Specialist Note (Signed)
Integrated Behavioral Health Initial Visit  MRN: [id number] Name: Yesenia Bennett  Number of [place] Clinician visits:: 1/6 Session Start time: 2:49  Session End time: 2:54 Total time: 15 minutes  Type of Service: [place] Interpretor:No. Interpretor Name and Language: n/a   Warm Hand Off Completed.       SUBJECTIVE: [name] is a 32 y.o. female accompanied by n/a Patient was referred by Emeterio Reeve, MD for hx of depression, new ob Patient reports the following symptoms/concerns: Pt states she is in a lot of hip pain today, and primary concern is transportation to ED and pain today, along with hyperemesis  Duration of problem: Increase in pregnancy; Severity of problem: moderately severe  OBJECTIVE: Mood: Irritable and appropriate for pain and Affect: Appropriate Risk of harm to self or others: No plan to harm self or others  LIFE CONTEXT: Family and Social: - School/Work: - Self-Care: - Life Changes: Current pregnancy , with arthritis of right hip  GOALS ADDRESSED:n/a  INTERVENTIONS: Standardized Assessments completed: GAD-7 and PHQ 9  ASSESSMENT: Patient currently experiencing Supervision of high risk pregnancy, antepartum   Patient may benefit from Initial OB introduction to integrated behavioral health services .  PLAN: 1. Follow up with behavioral health clinician on : One month, or earlier,as needed 2. Behavioral recommendations:  N/A 3. Referral(s): [facility] (In Clinic) 4. "From scale of 1-10, how likely are you to follow plan?": -  Garlan Fair, LCSW   Depression screen [facility] 2/9 08/17/2018  Decreased Interest 3  Down, Depressed, Hopeless 2  PHQ - 2 Score 5  Altered sleeping 3  Tired, decreased energy 3  Change in appetite 3  Feeling bad or failure about yourself  0  Trouble concentrating 0  Moving slowly or fidgety/restless 0  Suicidal thoughts 0   PHQ-9 Score 14   GAD 7 : Generalized Anxiety Score 08/17/2018  Nervous, Anxious, on Edge 2  Control/stop worrying 2  Worry too much - different things 2  Trouble relaxing 2  Restless 0  Easily annoyed or irritable 0  Afraid - awful might happen 1  Total GAD 7 Score 9
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
## 2018-08-17 NOTE — Progress Notes (Signed)
Pt. Came to the floor around 2030 from ED, alert and oriented x 4, attending doctor paged for admitting orders, call light within reach, will continue to monitor patient.

## 2018-08-18 ENCOUNTER — Other Ambulatory Visit: Payer: Self-pay

## 2018-08-18 DIAGNOSIS — Z91048 Other nonmedicinal substance allergy status: Secondary | ICD-10-CM

## 2018-08-18 DIAGNOSIS — F319 Bipolar disorder, unspecified: Secondary | ICD-10-CM

## 2018-08-18 DIAGNOSIS — E876 Hypokalemia: Secondary | ICD-10-CM

## 2018-08-18 DIAGNOSIS — Z8614 Personal history of Methicillin resistant Staphylococcus aureus infection: Secondary | ICD-10-CM

## 2018-08-18 DIAGNOSIS — F17210 Nicotine dependence, cigarettes, uncomplicated: Secondary | ICD-10-CM

## 2018-08-18 DIAGNOSIS — L0231 Cutaneous abscess of buttock: Secondary | ICD-10-CM

## 2018-08-18 DIAGNOSIS — Z86718 Personal history of other venous thrombosis and embolism: Secondary | ICD-10-CM

## 2018-08-18 DIAGNOSIS — M25551 Pain in right hip: Secondary | ICD-10-CM

## 2018-08-18 DIAGNOSIS — Z8619 Personal history of other infectious and parasitic diseases: Secondary | ICD-10-CM

## 2018-08-18 DIAGNOSIS — F4310 Post-traumatic stress disorder, unspecified: Secondary | ICD-10-CM

## 2018-08-18 DIAGNOSIS — K0889 Other specified disorders of teeth and supporting structures: Secondary | ICD-10-CM

## 2018-08-18 DIAGNOSIS — R112 Nausea with vomiting, unspecified: Secondary | ICD-10-CM

## 2018-08-18 DIAGNOSIS — F419 Anxiety disorder, unspecified: Secondary | ICD-10-CM

## 2018-08-18 DIAGNOSIS — S76319A Strain of muscle, fascia and tendon of the posterior muscle group at thigh level, unspecified thigh, initial encounter: Secondary | ICD-10-CM

## 2018-08-18 DIAGNOSIS — Z881 Allergy status to other antibiotic agents status: Secondary | ICD-10-CM

## 2018-08-18 DIAGNOSIS — R7989 Other specified abnormal findings of blood chemistry: Secondary | ICD-10-CM

## 2018-08-18 DIAGNOSIS — K219 Gastro-esophageal reflux disease without esophagitis: Secondary | ICD-10-CM

## 2018-08-18 DIAGNOSIS — Z331 Pregnant state, incidental: Secondary | ICD-10-CM

## 2018-08-18 DIAGNOSIS — Z91018 Allergy to other foods: Secondary | ICD-10-CM

## 2018-08-18 DIAGNOSIS — M069 Rheumatoid arthritis, unspecified: Secondary | ICD-10-CM

## 2018-08-18 DIAGNOSIS — K08409 Partial loss of teeth, unspecified cause, unspecified class: Secondary | ICD-10-CM

## 2018-08-18 LAB — COMPREHENSIVE METABOLIC PANEL
ALK PHOS: 93 U/L (ref 38–126)
ALT: 58 U/L — AB (ref 0–44)
AST: 58 U/L — AB (ref 15–41)
Albumin: 2.1 g/dL — ABNORMAL LOW (ref 3.5–5.0)
Anion gap: 8 (ref 5–15)
BILIRUBIN TOTAL: 0.5 mg/dL (ref 0.3–1.2)
CALCIUM: 8 mg/dL — AB (ref 8.9–10.3)
CO2: 22 mmol/L (ref 22–32)
Chloride: 104 mmol/L (ref 98–111)
Creatinine, Ser: 0.49 mg/dL (ref 0.44–1.00)
Glucose, Bld: 110 mg/dL — ABNORMAL HIGH (ref 70–99)
Potassium: 3.2 mmol/L — ABNORMAL LOW (ref 3.5–5.1)
Sodium: 134 mmol/L — ABNORMAL LOW (ref 135–145)
Total Protein: 5.2 g/dL — ABNORMAL LOW (ref 6.5–8.1)

## 2018-08-18 LAB — OBSTETRIC PANEL, INCLUDING HIV
ANTIBODY SCREEN: NEGATIVE
BASOS: 0 %
Basophils Absolute: 0 10*3/uL (ref 0.0–0.2)
EOS (ABSOLUTE): 0.2 10*3/uL (ref 0.0–0.4)
EOS: 2 %
HEMATOCRIT: 28 % — AB (ref 34.0–46.6)
HEP B S AG: NEGATIVE
HIV Screen 4th Generation wRfx: NONREACTIVE
Hemoglobin: 9.5 g/dL — ABNORMAL LOW (ref 11.1–15.9)
IMMATURE GRANS (ABS): 0 10*3/uL (ref 0.0–0.1)
Immature Granulocytes: 0 %
LYMPHS: 18 %
Lymphocytes Absolute: 1.6 10*3/uL (ref 0.7–3.1)
MCH: 26.9 pg (ref 26.6–33.0)
MCHC: 33.9 g/dL (ref 31.5–35.7)
MCV: 79 fL (ref 79–97)
MONOCYTES: 8 %
Monocytes Absolute: 0.7 10*3/uL (ref 0.1–0.9)
Neutrophils Absolute: 6.3 10*3/uL (ref 1.4–7.0)
Neutrophils: 72 %
Platelets: 249 10*3/uL (ref 150–450)
RBC: 3.53 x10E6/uL — ABNORMAL LOW (ref 3.77–5.28)
RDW: 14.3 % (ref 12.3–15.4)
RH TYPE: POSITIVE
RPR Ser Ql: NONREACTIVE
RUBELLA: 8.73 {index} (ref >0.99)
WBC: 8.8 10*3/uL (ref 3.4–10.8)

## 2018-08-18 MED ORDER — POTASSIUM CHLORIDE CRYS ER 20 MEQ PO TBCR
40.0000 meq | EXTENDED_RELEASE_TABLET | Freq: Two times a day (BID) | ORAL | Status: DC
  Filled 2018-08-18: qty 2

## 2018-08-18 MED ORDER — MAGNESIUM SULFATE 2 GM/50ML IV SOLN
2.0000 g | Freq: Once | INTRAVENOUS | Status: AC
  Administered 2018-08-18: 2 g via INTRAVENOUS
  Filled 2018-08-18: qty 50

## 2018-08-18 NOTE — Progress Notes (Signed)
Pharmacy Communication Note:  Reviewed all patient medications for safety in pregnancy. All currently active medications are acceptable for use in pregnancy. Will continue to monitor daily.   Albertina Parr, PharmD., BCPS Clinical Pharmacist Clinical phone for 08/18/18 until 3:30pm: [phone] If after 3:30pm, please refer to [facility] for unit-specific pharmacist

## 2018-08-18 NOTE — Discharge Summary (Signed)
Discharge Summary  Yesenia Bennett WUX:[id number] DOB: November 30, 1985  PCP: System, Pcp Not In  Admit date: 08/17/2018 Discharge date: 08/18/2018  Time spent: 25 minutes  Recommendations for Outpatient Follow-up:  1. Patient left AGAINST MEDICAL ADVICE.  Discharge Diagnoses:  Active Hospital Problems   Diagnosis Date Noted  . Osteomyelitis ([place]) 08/17/2018  . Hamstring tear 08/17/2018  . Hyponatremia 08/17/2018  . Hypokalemia 08/17/2018  . Transaminitis 08/17/2018  . Pregnancy 08/17/2018  . Asthma 08/17/2018    Resolved Hospital Problems  No resolved problems to display.    Vitals:   08/18/18 0534 08/18/18 1445  BP: 106/66 (!) 108/57  Pulse: 62 66  Resp: 20 16  Temp: 98.7 F (37.1 C) 98.9 F (37.2 C)  SpO2: 99% 100%    History of present illness:  Yesenia Bennett is a 32 y.o. female with medical history significant of rheumatoid arthritis, DVT, bipolar disorder, PTSD, anxiety, depression, GERD,  IV drug use, medical noncompliance, current pregnancy ([redacted] wks gestation) being sent from [facility] office for further evaluation of osteomyelitis and hamstring tear.   Per chart review, she was recently admitted to [facility] for the same problem but left AMA.  Patient was previously treated for osteomyelitis in May with daptomycin and vancomycin.  Patient states she was recently at [facility] but left to come here to [facility].  States she is having severe, excruciating right hip pain that is radiating down her right leg.  She is not able to bear weight on this leg.  She fell in her tub back in August and sustained a hamstring tear from that incidence.  States they gave her fentanyl and Toradol at [facility] and that seemed to help with the pain.  Patient states she has chronic nausea and vomiting due to her pregnancy and is not able to tolerate oral medications.  States previously morphine had caused her blood pressure to drop and she prefers not taking this medication.  In addition,  states Tylenol does not help with the pain at all.  Reports having fevers and chills at home.  Patient was crying in pain and no further history could be obtained from her.  08/18/2018: Patient seen and examined at bedside this morning.  Reports pain in her right hip.  Consulted pharmacy to check for potential toxicity of the medications available.  Consulted interventional radiology and orthopedic surgery for possible aspiration of her hip to send for culture.  Consulted OB/GYN this morning to assist in the management of this patient.  This afternoon received a call from RN about the patient wanting to leave AMA.  Spoke with the patient on the phone explaining the risks of leaving AMA which include bacteremia and possible death.  Patient expressed understanding and made decision to leave [place].   Hospital Course:  Principal Problem:   Osteomyelitis ([place]) Active Problems:   Hamstring tear   Hyponatremia   Hypokalemia   Transaminitis   Pregnancy   Asthma  Osteomyelitis of right hip/right hamstring tear Afebrile and no leukocytosis.  Hemodynamically stable.  MRI of right hip done at [facility] on August 15, 2018 showing evidence of osteomyelitis of the right pubic bones and complete right hamstring tendon tears with retraction and associated edema in the hamstring muscles.  MRI also showing multilocular fluid posterior to the right ischial tuberosity, abscess or evolving hematoma. ED physician Dr. Alvino Chapel discussed the case with Dr. Johnnye Sima from infectious disease who recommended sampling the fluid by IR before antibiotics should be started  since the patient appears stable. -Consulted IR  -Consulted infectious disease -Consulted pharmacy -Consulted OB/GYN  Mild hyponatremia Sodium 129. Likely related to dehydration/ decreased p.o. Intake. -IV fluid   Hypokalemia Potassium 2.9.  Likely related to dehydration/decreased p.o. Intake. -6 runs of IV Potassium 10 meq;  patient able to tolerate p.o. meds  Mild transaminitis LFTs mildly elevated (AST 75, ALT 75). Likely related to stress.  Pregnancy -[redacted] wks gestation. Seen by [facility] outpatient on 08/17/2018 -She will need OB follow-up after discharge  Anemia Hemoglobin 10.2. Hgb was 11.9 two weeks ago.  No signs of active bleeding  Asthma -Continue home albuterol inhaler as needed -Continue home Symbicort    Procedures:  None  Consultations: Infectious disease Interventional radiology Orthopedic surgery OB/GYN    Discharge Exam: BP (!) 108/57 (BP Location: Right Arm)   Pulse 66   Temp 98.9 F (37.2 C) (Oral)   Resp 16   Ht 5\' 9"  (1.753 m)   Wt 74.1 kg   LMP 04/02/2018   SpO2 100%   BMI 24.12 kg/m  . General: 32 y.o. year-old female well developed well nourished in no acute distress.  Alert and oriented x3. . Cardiovascular: Regular rate and rhythm with no rubs or gallops.  No thyromegaly or JVD noted.   Marland Kitchen Respiratory: Clear to auscultation with no wheezes or rales. Good inspiratory effort. . Abdomen: Soft nontender nondistended with normal bowel sounds x4 quadrants. . Musculoskeletal: Mild edema noted in right hip region. 2/4 pulses in all 4 extremities. Marland Kitchen Psychiatry: Mood is appropriate for condition and setting  Discharge Instructions You were cared for by a hospitalist during your hospital stay. If you have any questions about your discharge medications or the care you received while you were in the hospital after you are discharged, you can call the unit and asked to speak with the hospitalist on call if the hospitalist that took care of you is not available. Once you are discharged, your primary care physician will handle any further medical issues. Please note that NO REFILLS for any discharge medications will be authorized once you are discharged, as it is imperative that you return to your primary care physician (or establish a relationship with a primary care physician if you  do not have one) for your aftercare needs so that they can reassess your need for medications and monitor your lab values.   Allergies as of 08/18/2018      Reactions   Citric Acid Anaphylaxis   fruit   Citrus Anaphylaxis   rash   Erythromycin Anaphylaxis, Swelling, Rash   Adhesive [tape] Other (See Comments)   Blisters       Medication List    STOP taking these medications   Doxylamine-Pyridoxine 10-10 MG Tbec   metoCLOPramide 10 MG tablet Commonly known as:  REGLAN   omeprazole 20 MG capsule Commonly known as:  PRILOSEC   ondansetron 8 MG disintegrating tablet Commonly known as:  ZOFRAN-ODT   oxyCODONE-acetaminophen 5-325 MG tablet Commonly known as:  PERCOCET/ROXICET   promethazine 25 MG tablet Commonly known as:  PHENERGAN   scopolamine 1 MG/3DAYS Commonly known as:  TRANSDERM-SCOP     TAKE these medications   acetaminophen 650 MG suppository Commonly known as:  TYLENOL Place 1 suppository (650 mg total) rectally every 4 (four) hours as needed. What changed:  reasons to take this   albuterol 108 (90 Base) MCG/ACT inhaler Commonly known as:  PROVENTIL HFA;VENTOLIN HFA Inhale 2 puffs into the lungs every 4 (four)  hours as needed for wheezing or shortness of breath.   budesonide-formoterol 160-4.5 MCG/ACT inhaler Commonly known as:  SYMBICORT Inhale 2 puffs into the lungs 2 (two) times daily.      Allergies  Allergen Reactions  . Citric Acid Anaphylaxis    fruit  . Citrus Anaphylaxis    rash  . Erythromycin Anaphylaxis, Swelling and Rash  . Adhesive [Tape] Other (See Comments)    Blisters       The results of significant diagnostics from this hospitalization (including imaging, microbiology, ancillary and laboratory) are listed below for reference.    Significant Diagnostic Studies: No results found.  Microbiology: No results found for this or any previous visit (from the past 240 hour(s)).   Labs: Basic Metabolic Panel: Recent Labs  Lab  08/17/18 1723 08/17/18 1724 08/18/18 0606  NA 129* 130* 134*  K 2.9* 3.0* 3.2*  CL 93* 93* 104  CO2 25  --  22  GLUCOSE 93 93 110*  BUN <5* <3* <5*  CREATININE 0.45 0.40* 0.49  CALCIUM 8.6*  --  8.0*   Liver Function Tests: Recent Labs  Lab 08/17/18 1723 08/18/18 0606  AST 75* 58*  ALT 75* 58*  ALKPHOS 115 93  BILITOT 0.9 0.5  PROT 6.9 5.2*  ALBUMIN 2.8* 2.1*   No results for input(s): LIPASE, AMYLASE in the last 168 hours. No results for input(s): AMMONIA in the last 168 hours. CBC: Recent Labs  Lab 08/17/18 1723 08/17/18 1724  WBC 9.0  --   NEUTROABS 6.8  --   HGB 10.2* 10.5*  HCT 32.1* 31.0*  MCV 85.1  --   PLT 270  --    Cardiac Enzymes: No results for input(s): CKTOTAL, CKMB, CKMBINDEX, TROPONINI in the last 168 hours. BNP: BNP (last 3 results) No results for input(s): BNP in the last 8760 hours.  ProBNP (last 3 results) No results for input(s): PROBNP in the last 8760 hours.  CBG: No results for input(s): GLUCAP in the last 168 hours.     Signed:  Kayleen Memos, MD [facility] 08/18/2018, 4:56 PM

## 2018-08-18 NOTE — Progress Notes (Signed)
Pt called to nurse's station saying "the IV pole fell on me". RN came to bedside. IV pole on floor. RN asked how IV pole fell and pt stated "I was trying to pull the tray to me and it fell on the bed, but I pushed it off the bed so it would not hit me". RN examined RLE. No scratches or redness noted. Pt denied belly pain. Charge RN and Belva Chimes, [place] notified. [place] Crane, MD paged. Awaiting response. Will continue to monitor.

## 2018-08-18 NOTE — Progress Notes (Signed)
Pt decided to leave AMA. AMA form signed. [place] Crane, MD paged.

## 2018-08-18 NOTE — Consult Note (Signed)
[facility] for Infectious Disease    Date of Admission:  08/17/2018   Total days of antibiotics 0                Reason for Consult: Possible osteomyelitis/myositis of R hip    Referring Provider: Nevada Crane  Assessment: Yesenia Bennett is a 32 y.o. F 14 w pregnant with suspected ongoing infection involving her right hip. Her CRP at outside hospital on 10/05 was noted to be very high @ 142 where previously they previously reduced to < 10 with IV antibiotics. I think she likely has infection playing a role and unfortunately with her current pregnancy, history of injection drug use and failed PO therapy makes this difficult situation.   Hep C RNA (+) 06/29/2015, 1a genotype - will repeat RNA in AM. Also screen Hep B sAg and RPR (multiple sexual partners). GC/C of urine. LFTs elevated slightly with ALT 58. HIV negative this admission.    Plan: 1. Await orthopedic evaluation  2. Will continue to hold antibiotics for now awaiting eval (not IR candidate). If she does not have aspirate, back onto dapto, d/c with zyvox as previous. She will need to keep ID clinic appt.  3. Hep C RNA and Hep B sAg, RPR in AM   Principal Problem:   Osteomyelitis (HCC) Active Problems:   Hamstring tear   Hyponatremia   Hypokalemia   Transaminitis   Pregnancy   Asthma   . enoxaparin (LOVENOX) injection  40 mg Subcutaneous QHS  . mometasone-formoterol  2 puff Inhalation BID  . pantoprazole  40 mg Oral Daily  . potassium chloride  40 mEq Oral BID    HPI: Yesenia Bennett is a 32 y.o. female with significant medical history notable for current pregnancy (16w gestation with [facility] 01/27/2019), injection drug use (denies any injection since November 2018), rheumatoid arthritis, DVT, bipolar disorder, PTSD, anxiety, depression, GERD.   She tells me that she is having severe R hip pain with radiation down her right leg with inability to bare weight and has been using a cane to ambulate at home. She previously  had to have surgery in May of this year (outside hospital) for infection of the right hip. Was doing well until she fell in her tub in August of this year. She sought care at [facility] facility and was found to have possible septic arthritis of the right hip. For this she was treated with broad-spectrum antibiotics and had an MRI that showed inflammation versus infection versus tumor. ID was consulted and recommended CT guided biopsy of R hip which was not done d/t discovery of pregnancy. 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 Inflammatory markers improved with IV therapy and remained afebrile through her stay. She was treated 8/30 - 9/06 with IV vancomycin (until developing redman syndrome, then IV daptomycin) with transition to PO linezolid intended through 07/25/18. Was instructed to make an appt for follow up with [facility] ID however she had multiple no shows at our clinic. Of note she has been having trouble keeping PO meds down d/t nausea/vomiting associated with pregnancy.   MRI of right hip done at [facility] on August 15, 2018 with evidence of osteomyelitis of the right pubic bones and complete right hamstring tendon tears with retraction and associated edema in the hamstring muscles; also with multilocular fluid posterior to the right ischial tuberosity, abscess  or evolving hematoma.  She is very frustrated that "no one will take care of this being she is pregnant and does not feel like she can survive in this pain until she has the baby." She also feels like her pain is not adequately controlled.   Review of Systems  Constitutional: Positive for chills and diaphoresis. Negative for fever.  Respiratory: Negative for cough, sputum production and shortness of breath.   Cardiovascular: Negative for chest pain and leg swelling.  Gastrointestinal: Positive for nausea and vomiting.  Negative for abdominal pain.  Genitourinary: Negative for dysuria.  Musculoskeletal: Positive for joint pain.  Skin: Negative for rash.  Neurological: Negative for dizziness and headaches.  Psychiatric/Behavioral: Insomnia: R hip      Past Medical History:  Diagnosis Date  . Anxiety and depression   . Bipolar disorder ([place])   . Cervical cancer ([place])    Unclear if patient had cervical cancer/ dysplasia, but underwent cryotherapy  . Chronic lower back pain   . Depression   . DVT (deep venous thrombosis) ([place]) 2010; 2016   "behind my knees; after my c-sections"  . Fibromyalgia   . Frequent UTI   . GERD (gastroesophageal reflux disease)   . Heart murmur    "born w/one; outgrew it" (11/03/2017)  . Hepatitis C   . Melanoma of forearm, right ([place])   . Migraine    "3-4/wk" (11/03/2017)  . PTSD (post-traumatic stress disorder)   . Rheumatoid arthritis ([place])   . 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 Smoking status: Current Every Day Smoker    Packs/day: 0.25    Years: 21.00    Pack years: 5.25    Types: Cigarettes  . Smokeless tobacco: Never Used  Substance Use Topics  . Alcohol use: No    Frequency: Never  . Drug use: Not Currently    Types: Marijuana, Heroin, IV    Comment: last use last week; former IV drug  ; used last 10/03/17    Family History  Problem Relation Age of Onset  . Heart disease Father   . Hypertension Father   . Hyperlipidemia Mother   . Breast cancer Mother    Allergies  Allergen Reactions  . Citric Acid Anaphylaxis    fruit  . Citrus Anaphylaxis    rash  . Erythromycin Anaphylaxis, Swelling and Rash  . Adhesive [Tape] Other (See Comments)    Blisters     OBJECTIVE: Blood pressure 106/66, pulse 62, temperature 98.7 F (37.1 C), temperature source Oral, resp. rate 20, height 5\' 9"  (1.753 m), weight 74.1 kg, last menstrual period 04/02/2018, SpO2 99 %.  Physical Exam  Constitutional: She is oriented to  person, place, and time. She appears well-developed and well-nourished.  Resting comfortably in bed. Eating french fries.   HENT:  Mouth/Throat: Oropharynx is clear and moist and mucous membranes are normal. No oral lesions. Normal dentition. No dental abscesses. No oropharyngeal exudate.  Poor dentition, multiple missing teeth.   Cardiovascular: Normal rate, regular rhythm, normal heart sounds and intact distal pulses.  No murmur heard. Pulmonary/Chest: Effort normal and breath sounds normal. No respiratory distress. She has no rales.  Abdominal: Soft. She exhibits no distension. There is no tenderness.  Musculoskeletal:  R leg pain. Unable to examine.   Lymphadenopathy:    She has no cervical adenopathy.  Neurological: She is alert and oriented to person, place, and time.  Skin: Skin is warm  and dry. No rash noted.  Psychiatric: She has a normal mood and affect. Judgment normal.  In good spirits today and engaged in care discussion    Lab Results Lab Results  Component Value Date   WBC 9.0 08/17/2018   HGB 10.5 (L) 08/17/2018   HCT 31.0 (L) 08/17/2018   MCV 85.1 08/17/2018   PLT 270 08/17/2018    Lab Results  Component Value Date   CREATININE 0.49 08/18/2018   BUN <5 (L) 08/18/2018   NA 134 (L) 08/18/2018   K 3.2 (L) 08/18/2018   CL 104 08/18/2018   CO2 22 08/18/2018    Lab Results  Component Value Date   ALT 58 (H) 08/18/2018   AST 58 (H) 08/18/2018   ALKPHOS 93 08/18/2018   BILITOT 0.5 08/18/2018     Microbiology: No results found for this or any previous visit (from the past 240 hour(s)).  Janene Madeira, MSN, NP-C [facility] for Infectious [place] [facility] Cell: [phone] Pager: [phone]  08/18/2018 2:43 PM   Patient seen and examined, agree with above note.  I dictated the care and orders written for this patient under my direction. I was immediately available on site, by phone/page to assist in the care of this patient.

## 2018-08-18 NOTE — Consult Note (Signed)
            [facility] OB/GYN Attending Consult Note   Consult Date: 08/18/2018  Reason for Consult: Pregnancy in the second trimester, recent admission for osteomyelitis Referring Physician: Dr. Beckie Salts  Yesenia Bennett is an 32 y.o. [id number] female at [redacted]w[redacted]d who was admitted for osteomyelitis. We were consulted given patient's pregnancy to give recommendations about analgesia and to ensure appropriate antibiotic therapy. Unfortunately, on my arrival to the unit here at [facility], I was informed that the patient had just signed out [name].  She is scheduled for a follow up appointment in our [place] office on 09/02/18 (she was just seen there yesterday 08/17/18).  If patient returns, please re-consult ID for antibiotic therapy.  For her analgesia, as long as she is in second trimester, she can have a limited (48 -72 hours) course of NSAIDs, but will likely need narcotics.  There is associated dependence and neonatal withdrawal risk with narcotics, but her pain will need to be treated.  If she returns, please call [phone] for any obstetric questions or concerns.   Verita Schneiders, MD, [place] for [employer], [place]

## 2018-08-18 NOTE — Progress Notes (Signed)
Pt. refused lovenox scheduled at 2230 on 08/17/18 , pt has been educated on it. Will continue to monitor

## 2018-08-18 NOTE — Plan of Care (Signed)
  Problem: Education: Goal: Knowledge of General Education information will improve Description: Including pain rating scale, medication(s)/side effects and non-pharmacologic comfort measures Outcome: Progressing   Problem: Clinical Measurements: Goal: Respiratory complications will improve Outcome: Progressing   Problem: Coping: Goal: Level of anxiety will decrease Outcome: Progressing   Problem: Pain Managment: Goal: General experience of comfort will improve Outcome: Progressing   Problem: Safety: Goal: Ability to remain free from injury will improve Outcome: Progressing   

## 2018-08-18 NOTE — Progress Notes (Signed)
[facility] Coordinator will follow pt with ID team to support Home Infusion Pharmacy services if needed at DC to home.  If patient discharges after hours, please call [phone].   Yesenia Bennett 08/18/2018, 3:13 PM

## 2018-08-25 ENCOUNTER — Encounter: Payer: Self-pay | Admitting: *Deleted

## 2018-08-26 ENCOUNTER — Encounter (HOSPITAL_COMMUNITY): Payer: Self-pay

## 2018-08-26 ENCOUNTER — Ambulatory Visit (INDEPENDENT_AMBULATORY_CARE_PROVIDER_SITE_OTHER): Payer: Self-pay | Admitting: Physician Assistant

## 2018-08-31 ENCOUNTER — Telehealth: Payer: Self-pay | Admitting: *Deleted

## 2018-08-31 NOTE — Telephone Encounter (Signed)
Received message patient Yesenia Bennett ortho referral, she may call to reschedule.

## 2018-09-02 ENCOUNTER — Encounter (HOSPITAL_COMMUNITY): Payer: Self-pay

## 2018-09-02 ENCOUNTER — Encounter: Payer: Self-pay | Admitting: Family Medicine

## 2018-09-02 ENCOUNTER — Ambulatory Visit (HOSPITAL_COMMUNITY)
Admission: RE | Admit: 2018-09-02 | Discharge: 2018-09-02 | Disposition: A | Discharge status: Home / Self Care | Payer: Medicaid Other | Source: Ambulatory Visit | Attending: Obstetrics & Gynecology | Admitting: Obstetrics & Gynecology

## 2018-09-02 ENCOUNTER — Encounter: Payer: Medicaid Other | Admitting: Family Medicine

## 2018-09-02 DIAGNOSIS — B182 Chronic viral hepatitis C: Secondary | ICD-10-CM | POA: Diagnosis not present

## 2018-09-02 DIAGNOSIS — O98412 Viral hepatitis complicating pregnancy, second trimester: Secondary | ICD-10-CM | POA: Insufficient documentation

## 2018-09-02 DIAGNOSIS — Z3A19 19 weeks gestation of pregnancy: Secondary | ICD-10-CM | POA: Insufficient documentation

## 2018-09-02 DIAGNOSIS — O26892 Other specified pregnancy related conditions, second trimester: Secondary | ICD-10-CM | POA: Insufficient documentation

## 2018-09-02 DIAGNOSIS — O0992 Supervision of high risk pregnancy, unspecified, second trimester: Secondary | ICD-10-CM | POA: Diagnosis not present

## 2018-09-02 DIAGNOSIS — Z363 Encounter for antenatal screening for malformations: Secondary | ICD-10-CM

## 2018-09-02 DIAGNOSIS — O34219 Maternal care for unspecified type scar from previous cesarean delivery: Secondary | ICD-10-CM | POA: Insufficient documentation

## 2018-09-02 DIAGNOSIS — Z86718 Personal history of other venous thrombosis and embolism: Secondary | ICD-10-CM | POA: Insufficient documentation

## 2018-09-02 DIAGNOSIS — F1910 Other psychoactive substance abuse, uncomplicated: Secondary | ICD-10-CM | POA: Insufficient documentation

## 2018-09-02 DIAGNOSIS — N898 Other specified noninflammatory disorders of vagina: Secondary | ICD-10-CM | POA: Insufficient documentation

## 2018-09-02 DIAGNOSIS — Z3689 Encounter for other specified antenatal screening: Secondary | ICD-10-CM | POA: Insufficient documentation

## 2018-09-02 DIAGNOSIS — O99322 Drug use complicating pregnancy, second trimester: Secondary | ICD-10-CM | POA: Diagnosis not present

## 2018-09-02 DIAGNOSIS — O0990 Supervision of high risk pregnancy, unspecified, unspecified trimester: Secondary | ICD-10-CM

## 2018-09-02 NOTE — Progress Notes (Signed)
Patient did not keep appointment today. She will be called to reschedule.  

## 2018-09-15 ENCOUNTER — Other Ambulatory Visit (HOSPITAL_COMMUNITY): Payer: Self-pay | Admitting: *Deleted

## 2018-09-15 DIAGNOSIS — Z362 Encounter for other antenatal screening follow-up: Secondary | ICD-10-CM

## 2018-09-30 ENCOUNTER — Ambulatory Visit (HOSPITAL_COMMUNITY): Payer: Medicaid Other

## 2018-09-30 ENCOUNTER — Encounter (HOSPITAL_COMMUNITY): Payer: Self-pay

## 2018-10-07 ENCOUNTER — Telehealth: Payer: Self-pay | Admitting: Family Medicine

## 2018-10-07 NOTE — Telephone Encounter (Signed)
Called patient about her missed appointment. She stated she was in the [facility] in [place]. She had hip surgery. She will call us when she gets out, and is able to see a provider.

## 2018-10-18 IMAGING — CT CT ABD-PELV W/ CM
2 of 4 series · 15 of 46 positions shown, 17 images · IV contrast (Omni 300)
Comparison: CT 11/07/2017

CLINICAL DATA: Abdominal pain and fever. Cholecystectomy 1 week
prior 11/06/2017

EXAM:
CT ABDOMEN AND PELVIS WITH CONTRAST
TECHNIQUE: Multidetector CT imaging of the abdomen and pelvis was performed
using the standard protocol following bolus administration of
intravenous contrast.
CONTRAST:  100mL IE77IC-YII IOPAMIDOL (IE77IC-YII) INJECTION 61%

[Series 3: a/p w/ 5mm · axial · 0.66mm/px · z∈[+682,+1102]mm · 12 of 100 slices shown, 14 images]
[im 8/100  soft-tissue]
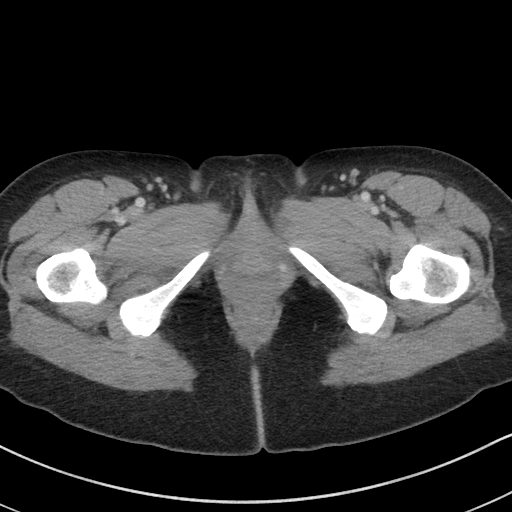
[im 8/100  bone]
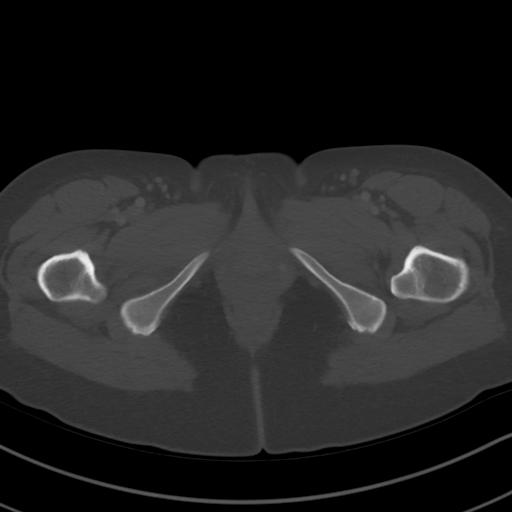
[im 16/100  soft-tissue]
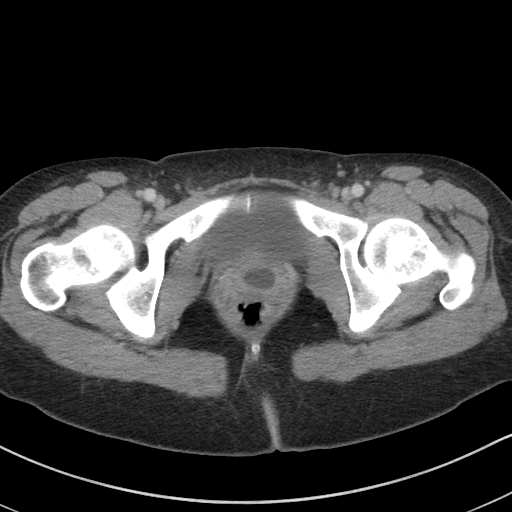
[im 23/100  soft-tissue]
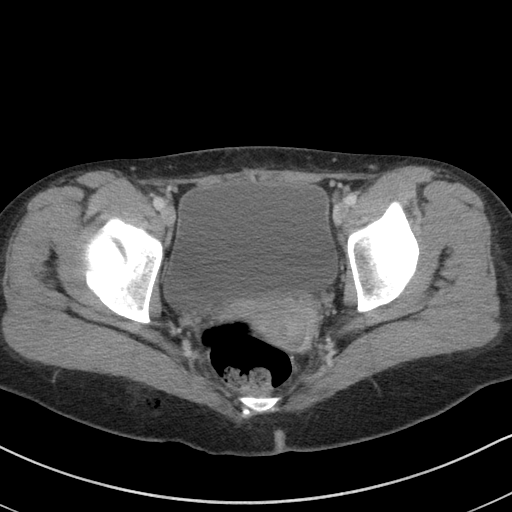
[im 31/100  soft-tissue]
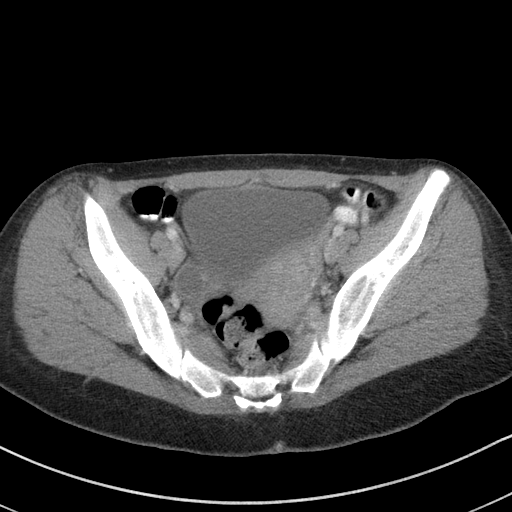
[im 39/100  soft-tissue]
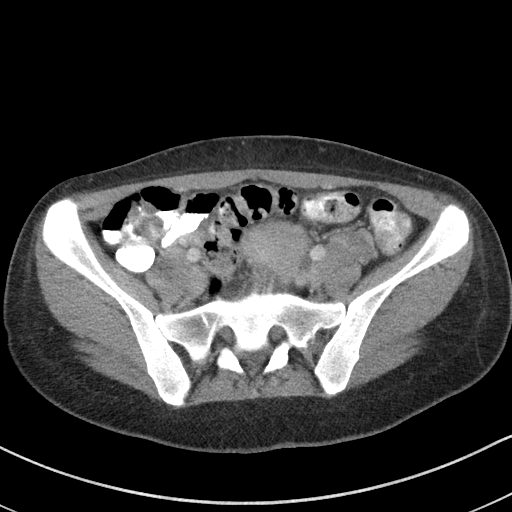
[im 46/100  soft-tissue]
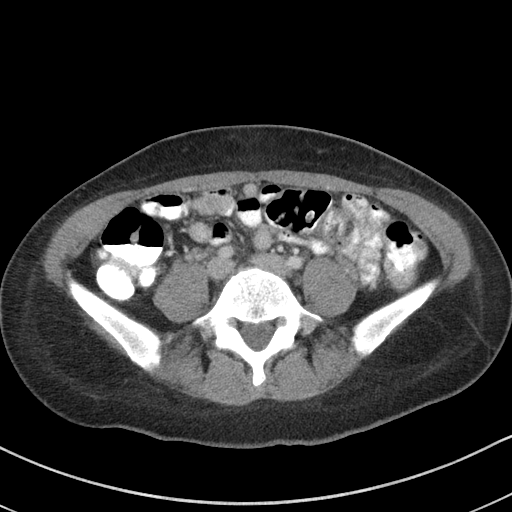
[im 54/100  soft-tissue]
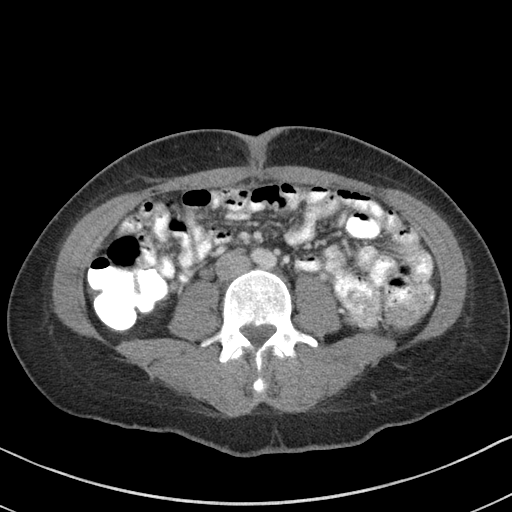
[im 61/100  soft-tissue]
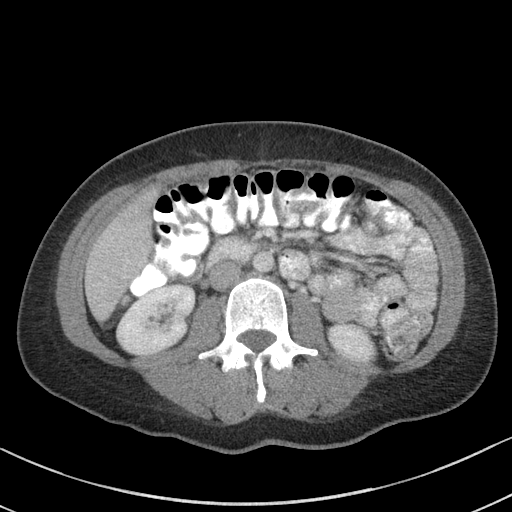
[im 69/100  soft-tissue]
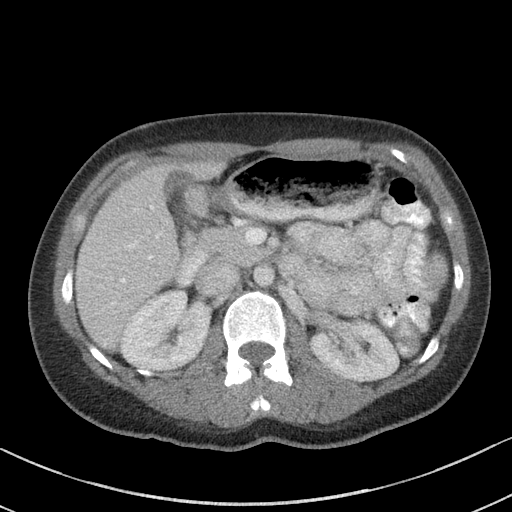
[im 69/100  bone]
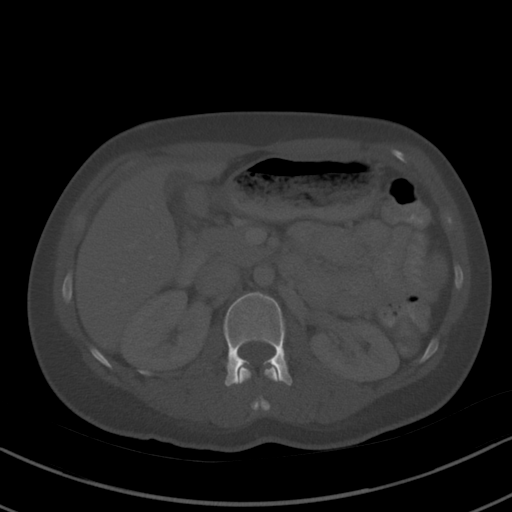
[im 77/100  soft-tissue]
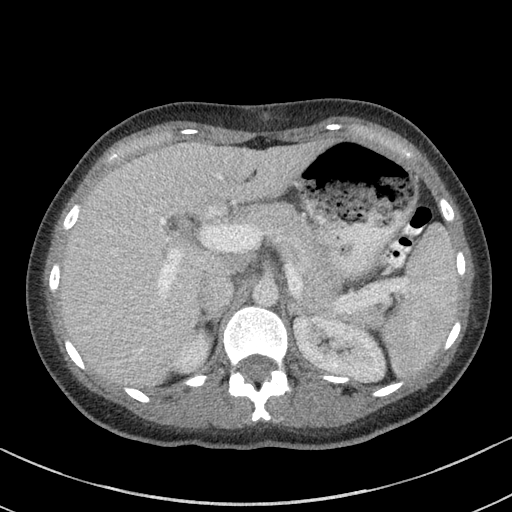
[im 84/100  soft-tissue]
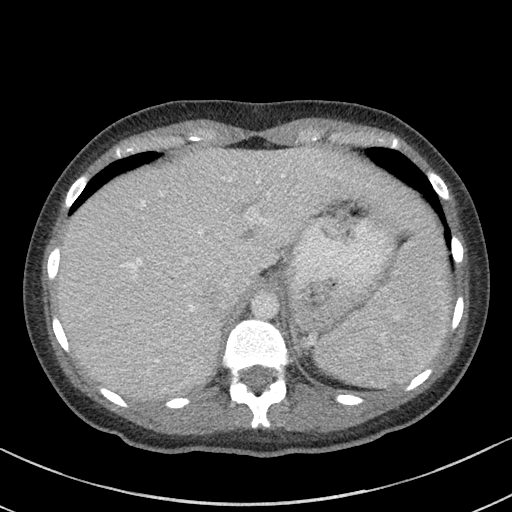
[im 92/100  soft-tissue]
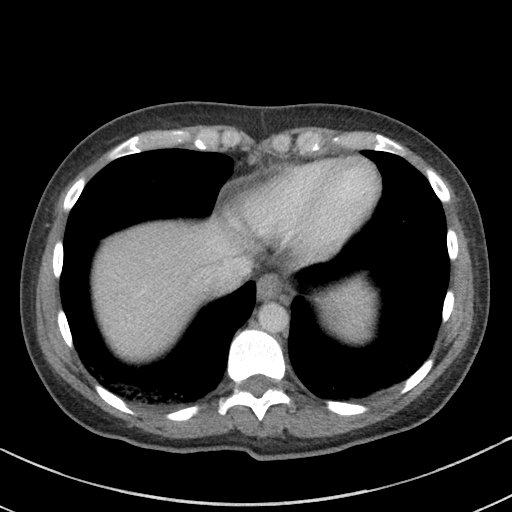

[Series 6: a/p w/ cor · coronal · 0.72mm/px · 3 of 119 slices shown]
[im 40/119  soft-tissue]
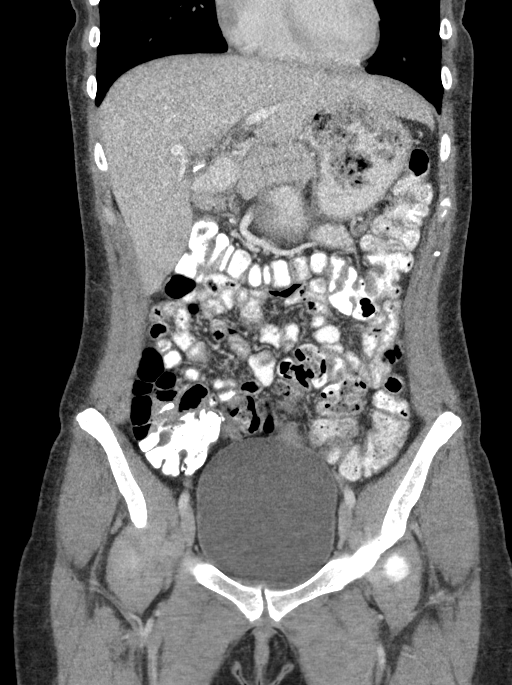
[im 53/119  soft-tissue]
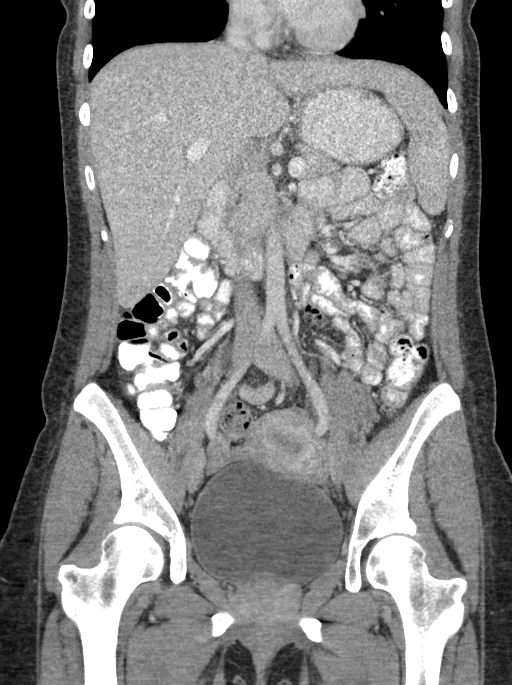
[im 66/119  soft-tissue]
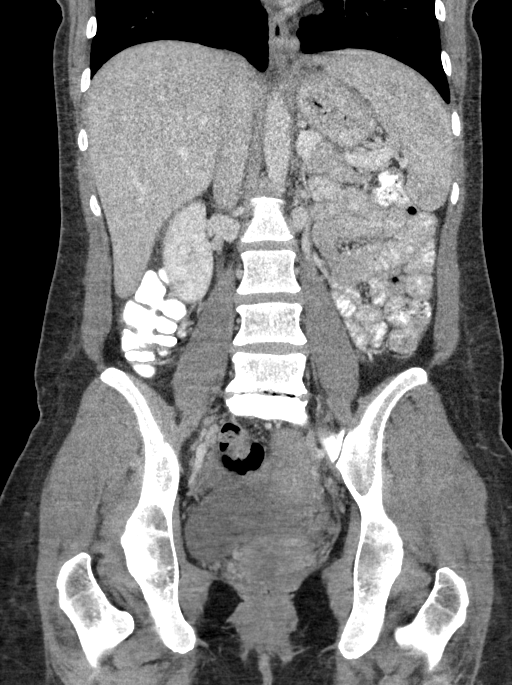

[15 of 46 positions shown; findings below may reference images not displayed]

FINDINGS: Lower chest: Mild dependent atelectasis. Trace pleural
fluid/thickening.

Hepatobiliary: Postcholecystectomy with clips in the gallbladder
fossa. Minimal fluid in the gallbladder fossa adjacent to surgical
clips is unchanged from prior exam. No focal hepatic lesion. No
biliary dilatation.

Pancreas: No ductal dilatation or inflammation.

Spleen: Normal in size without focal abnormality.

Adrenals/Urinary Tract: Normal adrenal glands. No hydronephrosis or
perinephric edema. Homogeneous renal enhancement. Previous decreased
cortical enhancement of the upper right kidney is no longer seen.
Urinary bladder is physiologically distended without wall
thickening.

Stomach/Bowel: Stomach is within normal limits. Appendix is normal
and filled with enteric contrast, image 60 series 3. No evidence of
bowel wall thickening, distention, or inflammatory changes.

Vascular/Lymphatic: Retroaortic left renal vein. Normal caliber
abdominal aorta. No enlarged abdominal or pelvic lymph nodes.

Reproductive: Heterogeneous uterine enhancement, possible posterior
fundal fibroid. 19 mm physiologic cyst in the right ovary. Left
ovary is normal in size. Midline vaginal cyst is unchanged.

Other: Postsurgical change in the anterior abdominal wall with small
focus of air in the midline consistent with recent surgery. No
evidence of subcutaneous abscess. Prior free air has resolved.
Decreased free fluid in the right pericolic gutter.

Musculoskeletal: Degenerative disc disease at L5-S1. There are no
acute or suspicious osseous abnormalities.
IMPRESSION: 1. Post recent cholecystectomy with minimal fluid in the gallbladder
fossa, unchanged from recent CT, may be a small postoperative
seroma. Decreased fluid in the right pericolic gutter. No evidence
of intra-abdominal abscess or findings to suggest bile leak.
2. No new abnormality in the abdomen or pelvis. Previous questioned
decreased enhancement of the upper right kidney is no longer seen.

## 2018-11-17 ENCOUNTER — Encounter: Payer: Self-pay | Admitting: *Deleted

## 2018-11-26 ENCOUNTER — Encounter: Payer: Self-pay | Admitting: *Deleted

## 2018-11-30 MED ORDER — CALCIUM CARBONATE ANTACID 500 MG PO CHEW
500.00 | CHEWABLE_TABLET | ORAL | Status: DC
Start: ? — End: 2018-11-30

## 2019-04-21 ENCOUNTER — Encounter (HOSPITAL_COMMUNITY): Payer: Self-pay
